# Patient Record
Sex: Male | Born: 1965 | Race: White | Hispanic: No | Marital: Married | Smoking: Current every day smoker
Health system: Southern US, Community
[De-identification: ages and names within clinical notes are randomized; demographics above are authoritative.]

## PROBLEM LIST (undated history)

## (undated) DIAGNOSIS — C61 Malignant neoplasm of prostate: Secondary | ICD-10-CM

## (undated) DIAGNOSIS — I10 Essential (primary) hypertension: Secondary | ICD-10-CM

## (undated) DIAGNOSIS — M1990 Unspecified osteoarthritis, unspecified site: Secondary | ICD-10-CM

## (undated) DIAGNOSIS — E7800 Pure hypercholesterolemia, unspecified: Secondary | ICD-10-CM

## (undated) DIAGNOSIS — M109 Gout, unspecified: Secondary | ICD-10-CM

## (undated) DIAGNOSIS — K259 Gastric ulcer, unspecified as acute or chronic, without hemorrhage or perforation: Secondary | ICD-10-CM

## (undated) HISTORY — PX: KNEE ARTHROSCOPY: SUR90

## (undated) HISTORY — PX: OTHER SURGICAL HISTORY: SHX169

## (undated) HISTORY — PX: APPENDECTOMY: SHX54

## (undated) HISTORY — PX: UMBILICAL HERNIA REPAIR: SHX196

---

## 1998-11-05 ENCOUNTER — Encounter: Payer: Self-pay | Admitting: Family Medicine

## 1998-11-05 ENCOUNTER — Ambulatory Visit (HOSPITAL_COMMUNITY): Admission: RE | Admit: 1998-11-05 | Discharge: 1998-11-05 | Payer: Self-pay | Admitting: Family Medicine

## 1998-11-05 ENCOUNTER — Inpatient Hospital Stay (HOSPITAL_COMMUNITY): Admission: EM | Admit: 1998-11-05 | Discharge: 1998-11-08 | Payer: Self-pay | Admitting: Emergency Medicine

## 1998-11-06 ENCOUNTER — Encounter: Payer: Self-pay | Admitting: Family Medicine

## 1998-11-14 ENCOUNTER — Encounter: Payer: Self-pay | Admitting: Surgery

## 1998-11-14 ENCOUNTER — Ambulatory Visit (HOSPITAL_COMMUNITY): Admission: RE | Admit: 1998-11-14 | Discharge: 1998-11-14 | Payer: Self-pay | Admitting: Surgery

## 1999-01-20 ENCOUNTER — Observation Stay (HOSPITAL_COMMUNITY): Admission: RE | Admit: 1999-01-20 | Discharge: 1999-01-21 | Payer: Self-pay | Admitting: Surgery

## 1999-01-20 ENCOUNTER — Encounter (INDEPENDENT_AMBULATORY_CARE_PROVIDER_SITE_OTHER): Payer: Self-pay | Admitting: Specialist

## 1999-07-20 ENCOUNTER — Emergency Department (HOSPITAL_COMMUNITY): Admission: EM | Admit: 1999-07-20 | Discharge: 1999-07-20 | Payer: Self-pay

## 2007-04-10 ENCOUNTER — Ambulatory Visit: Payer: Self-pay | Admitting: Surgery

## 2007-04-10 ENCOUNTER — Inpatient Hospital Stay (HOSPITAL_COMMUNITY): Admission: EM | Admit: 2007-04-10 | Discharge: 2007-04-27 | Payer: Self-pay | Admitting: Emergency Medicine

## 2007-04-10 ENCOUNTER — Encounter: Admission: RE | Admit: 2007-04-10 | Discharge: 2007-04-10 | Payer: Self-pay | Admitting: Family Medicine

## 2007-04-11 ENCOUNTER — Encounter: Payer: Self-pay | Admitting: Surgery

## 2007-04-20 ENCOUNTER — Ambulatory Visit: Payer: Self-pay | Admitting: Internal Medicine

## 2007-05-09 ENCOUNTER — Ambulatory Visit: Payer: Self-pay | Admitting: Thoracic Surgery (Cardiothoracic Vascular Surgery)

## 2007-05-09 ENCOUNTER — Encounter
Admission: RE | Admit: 2007-05-09 | Discharge: 2007-05-09 | Payer: Self-pay | Admitting: Thoracic Surgery (Cardiothoracic Vascular Surgery)

## 2007-05-15 ENCOUNTER — Encounter
Admission: RE | Admit: 2007-05-15 | Discharge: 2007-05-15 | Payer: Self-pay | Admitting: Thoracic Surgery (Cardiothoracic Vascular Surgery)

## 2007-05-15 ENCOUNTER — Ambulatory Visit: Payer: Self-pay | Admitting: Thoracic Surgery (Cardiothoracic Vascular Surgery)

## 2007-05-23 ENCOUNTER — Ambulatory Visit: Payer: Self-pay | Admitting: Surgery

## 2007-05-23 ENCOUNTER — Encounter: Admission: RE | Admit: 2007-05-23 | Discharge: 2007-05-23 | Payer: Self-pay | Admitting: Surgery

## 2007-06-06 ENCOUNTER — Ambulatory Visit: Payer: Self-pay | Admitting: Surgery

## 2007-07-04 ENCOUNTER — Encounter: Admission: RE | Admit: 2007-07-04 | Discharge: 2007-07-04 | Payer: Self-pay | Admitting: Surgery

## 2007-07-04 ENCOUNTER — Ambulatory Visit: Payer: Self-pay | Admitting: Surgery

## 2008-09-17 IMAGING — CR DG CHEST 2V
2 series · 2 of 2 positions shown · non-contrast
Comparison: 05/09/2007

CLINICAL DATA: Empyema

CHEST - 2 VIEW

[w chest pa]
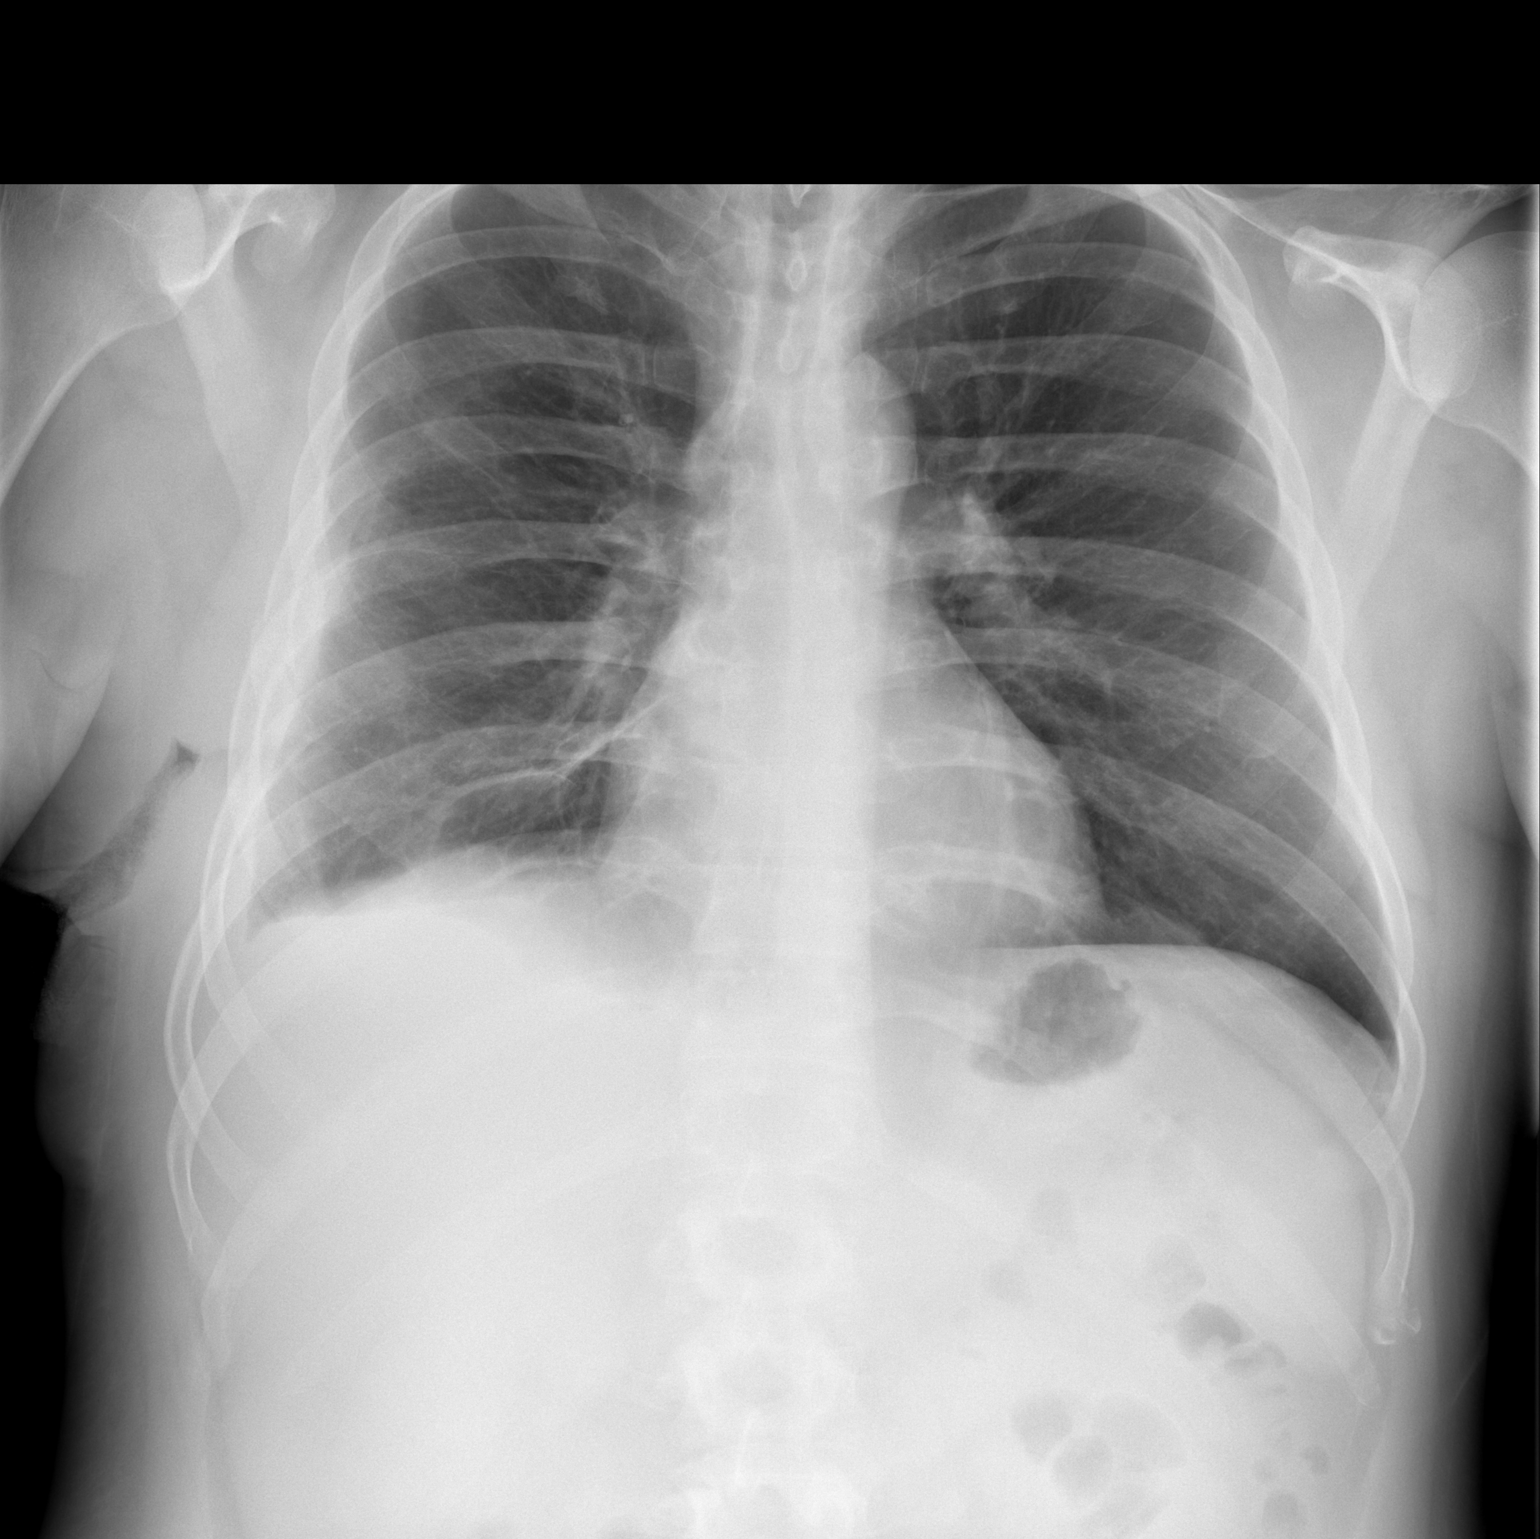

[w chest lat]
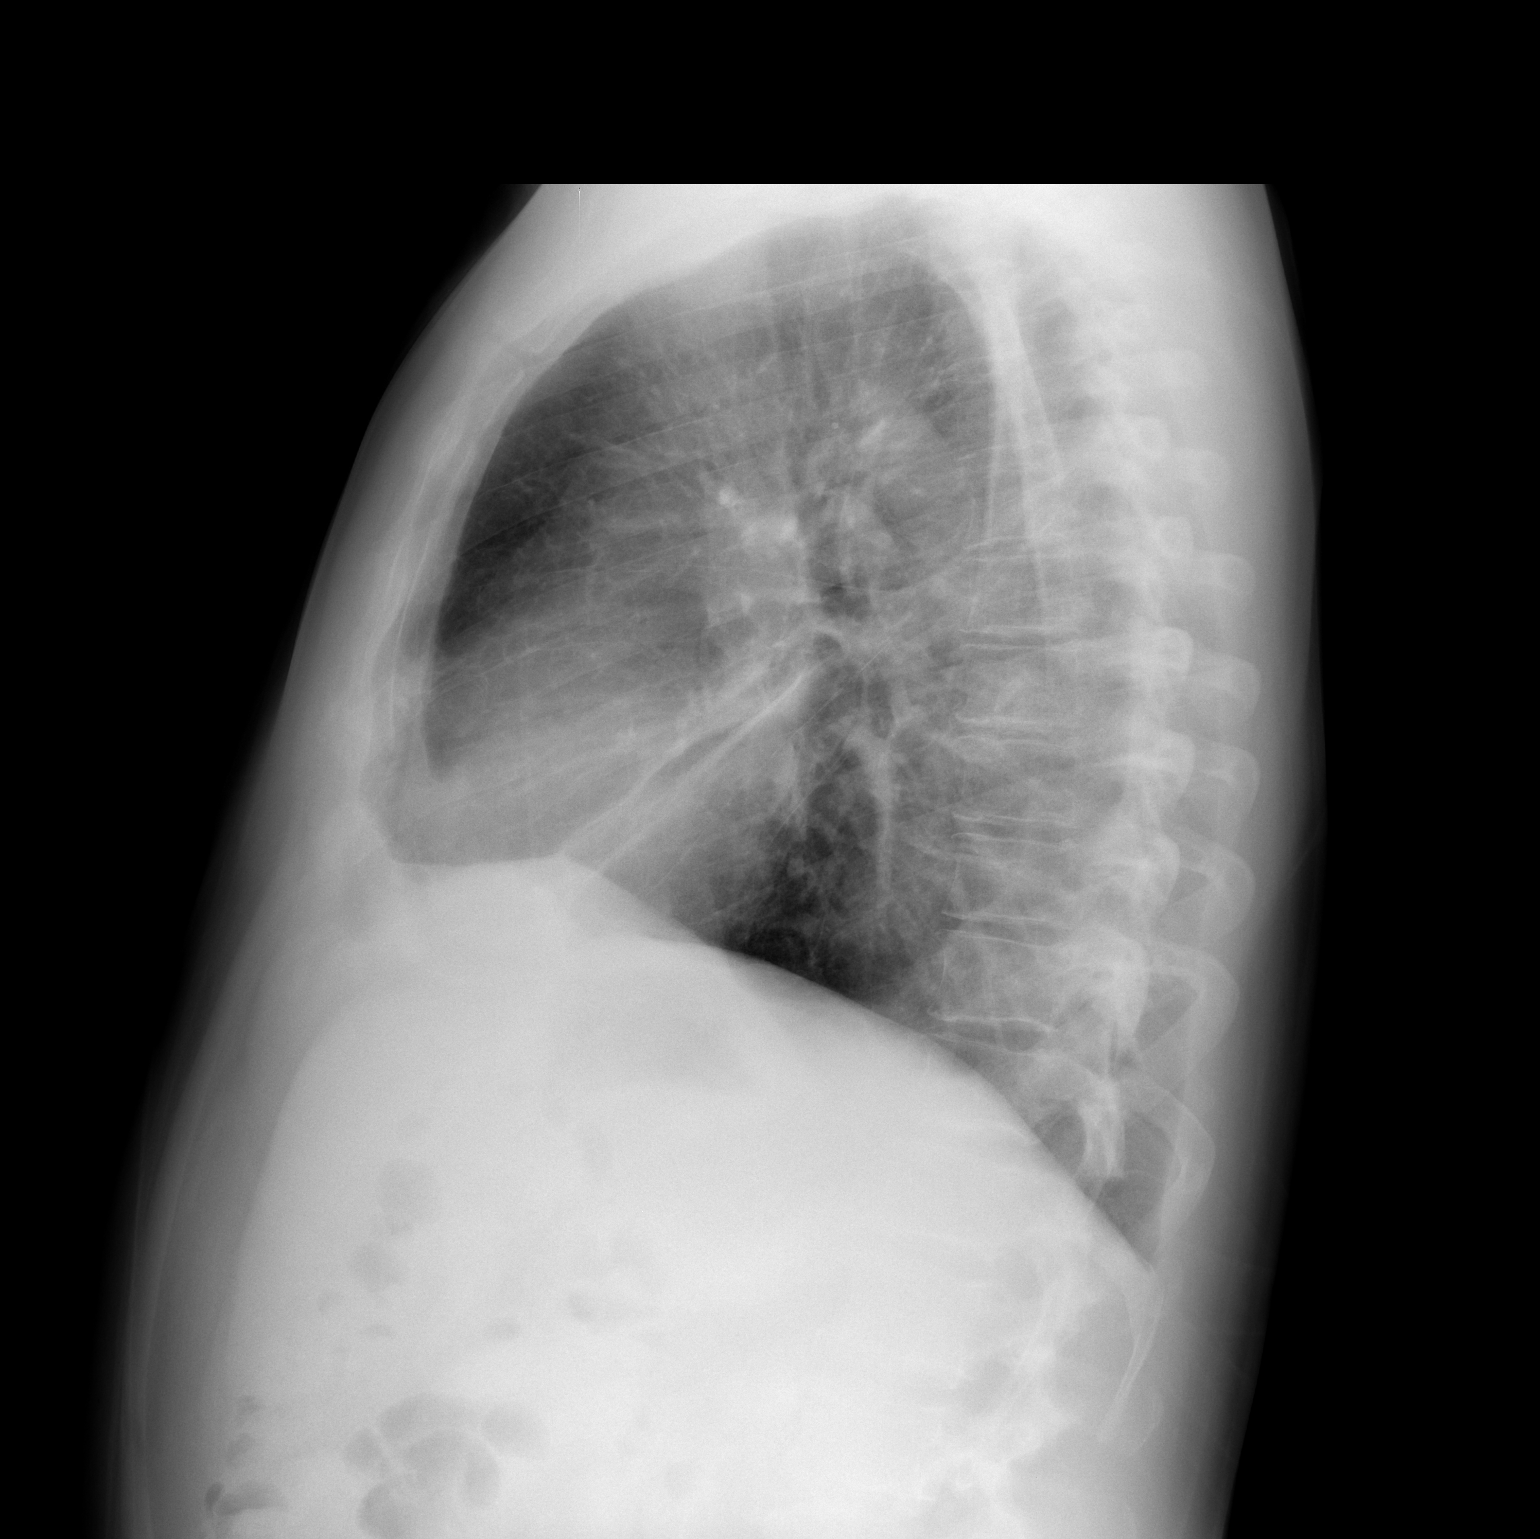

[2 of 2 positions shown; findings below may reference images not displayed]

FINDINGS: The right pleural effusion has again improved.  Minimal
pleural thickening persists. There is no evidence of pneumothorax.
Linear atelectasis at the right base is noted.  No consolidation.
The left lung is clear.  The heart is normal in size.
IMPRESSION: Further improvement in the right pleural effusion.  The residual
abnormalities likely reflects minimal fluid and pleural thickening.
No pneumothorax.

## 2008-09-25 IMAGING — CR DG CHEST 2V
2 series · 2 of 2 positions shown · non-contrast
Comparison: 05/15/2007

CLINICAL DATA: Empyema

CHEST - 2 VIEW

[w chest pa]
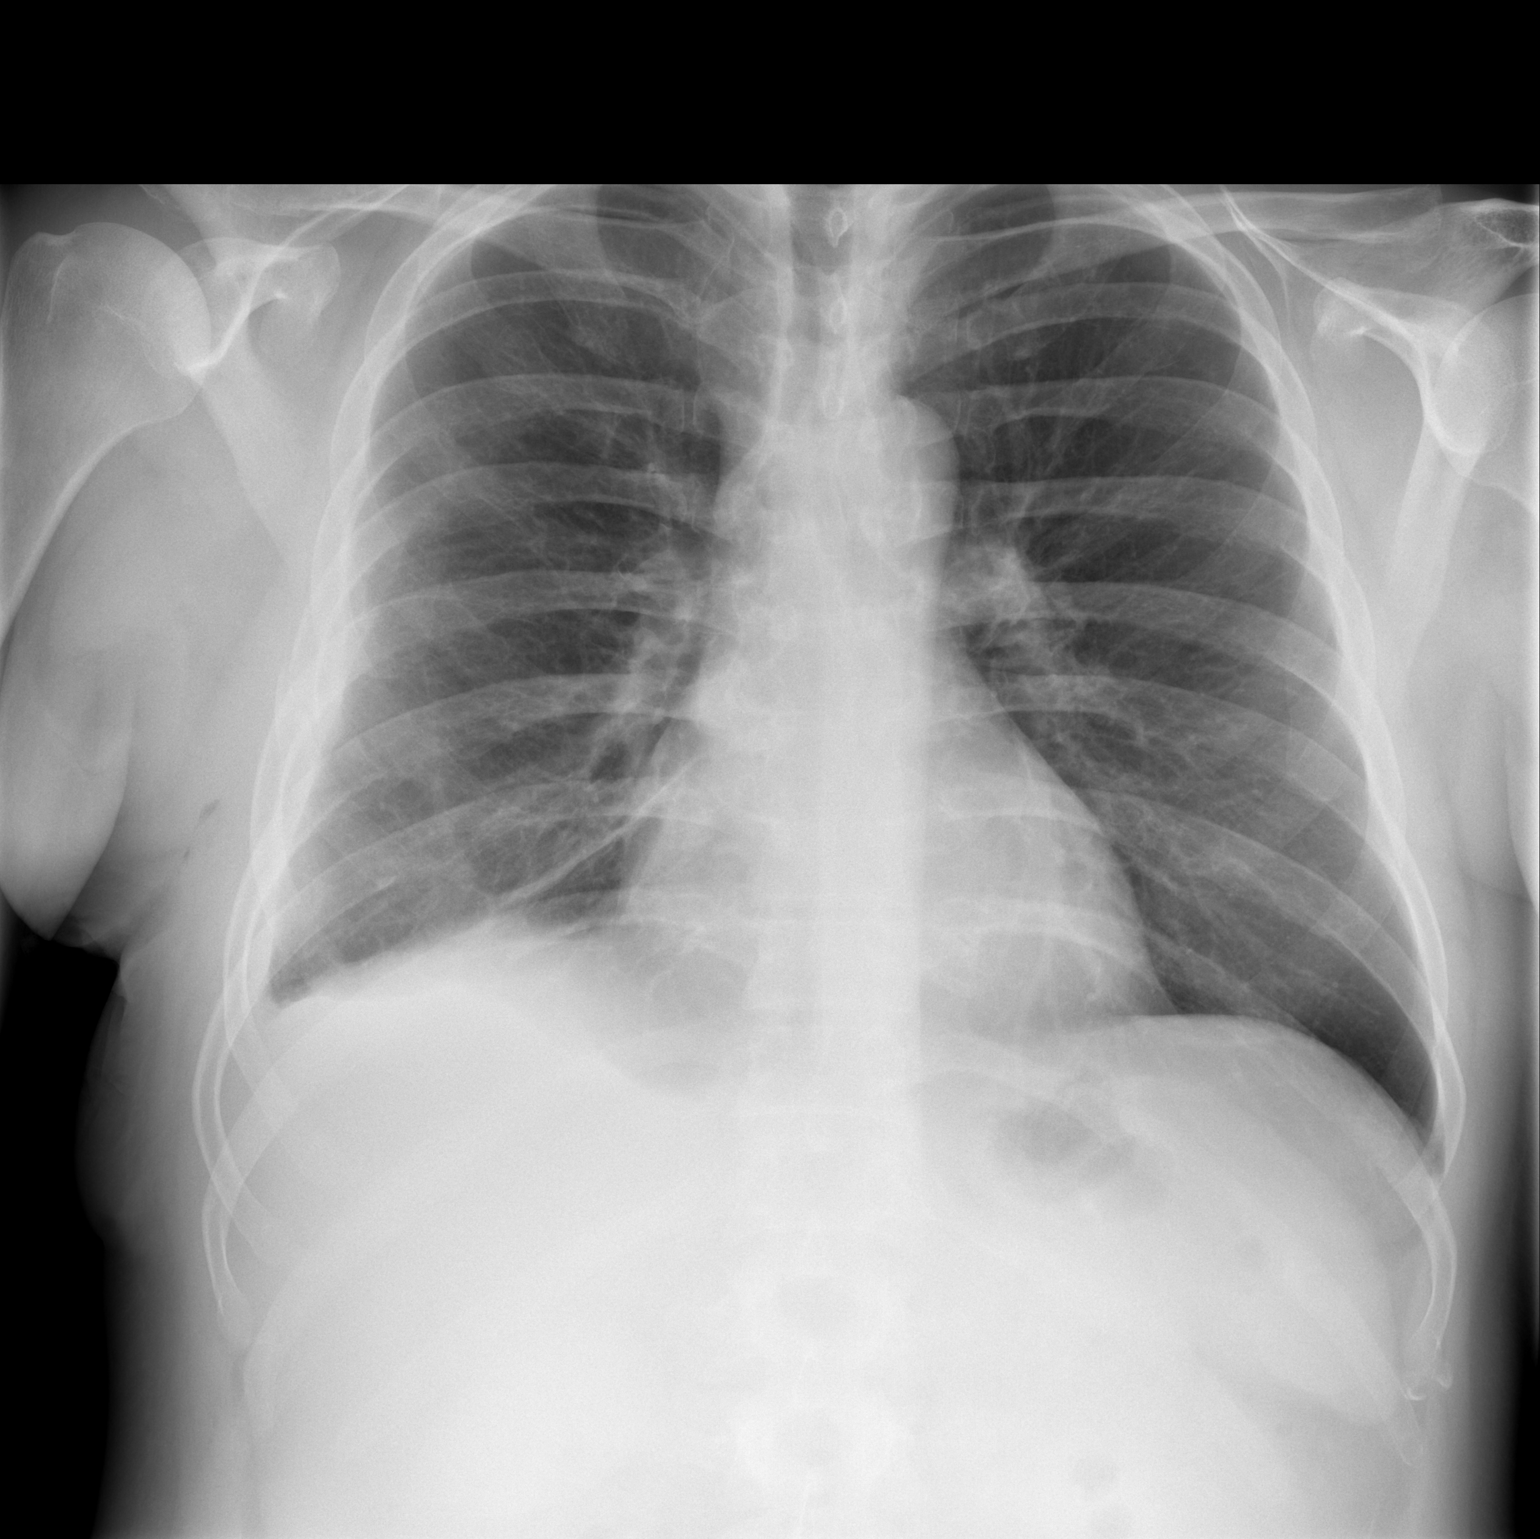

[w chest lat]
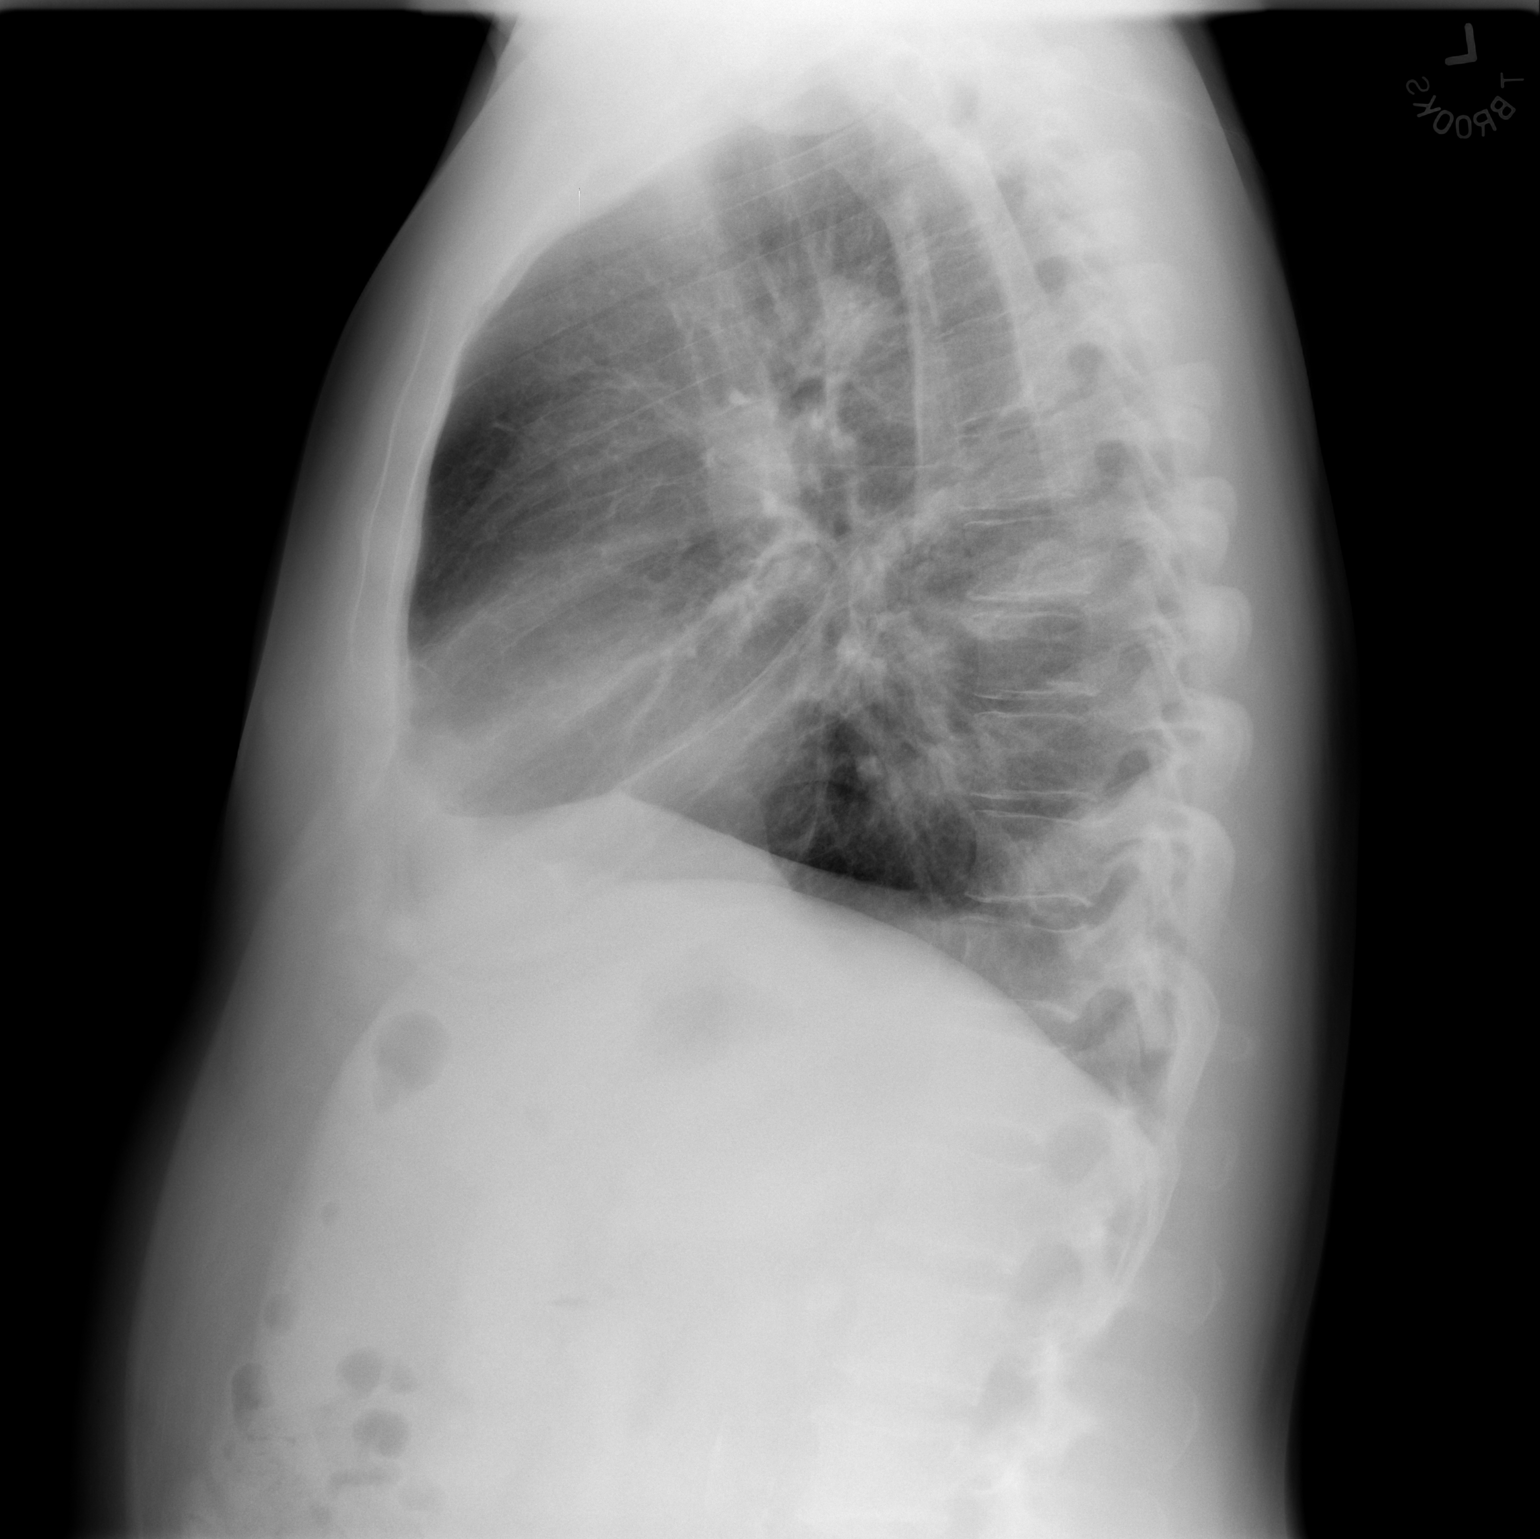

[2 of 2 positions shown; findings below may reference images not displayed]

FINDINGS: A small right pleural effusion versus minimal pleural
thickening and associated right basilar atelectasis have slightly
improved.  No pneumothorax. The heart remains normal in size.
Bronchitic changes are stable.
IMPRESSION: Further improvement and right basilar atelectasis and associated
right pleural thickening versus minimal pleural fluid.  Minimal
residual disease persists.

## 2009-03-21 ENCOUNTER — Ambulatory Visit (HOSPITAL_BASED_OUTPATIENT_CLINIC_OR_DEPARTMENT_OTHER): Admission: RE | Admit: 2009-03-21 | Discharge: 2009-03-21 | Payer: Self-pay | Admitting: Orthopedic Surgery

## 2010-03-30 ENCOUNTER — Emergency Department (HOSPITAL_COMMUNITY)
Admission: EM | Admit: 2010-03-30 | Discharge: 2010-03-31 | Discharge status: Against Medical Advice | Payer: BC Managed Care – PPO | Attending: Emergency Medicine | Admitting: Emergency Medicine

## 2010-03-30 DIAGNOSIS — S0100XA Unspecified open wound of scalp, initial encounter: Secondary | ICD-10-CM | POA: Insufficient documentation

## 2010-03-30 DIAGNOSIS — W19XXXA Unspecified fall, initial encounter: Secondary | ICD-10-CM | POA: Insufficient documentation

## 2010-03-30 LAB — POCT I-STAT 4, (NA,K, GLUC, HGB,HCT)
Glucose, Bld: 113 mg/dL — ABNORMAL HIGH (ref 70–99)
Potassium: 3.7 mEq/L (ref 3.5–5.1)

## 2010-05-19 NOTE — H&P (Signed)
NAMEPARISH, DUBOSE              ACCOUNT NO.:  [id number]   MEDICAL RECORD NO.:  [id number]          PATIENT TYPE:  EMS   LOCATION:  MAJO                         FACILITY:  [facility]   PHYSICIAN:  Evelene Croon, M.D.     DATE OF BIRTH:  09-25-1965   DATE OF ADMISSION:  04/10/2007  DATE OF DISCHARGE:                              HISTORY & PHYSICAL   REASON FOR ADMISSION:  Right empyema.   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  He thought that he  had pulled a muscle and got an abdominal binder to support this area.  The next 3 days, he had severe pain in this same location, especially  with coughing.  He had a dry nonproductive cough and some shortness of  breath.  Then on Thursday and Friday this week, he felt better.  On  Saturday and Sunday, he worsened with more pain, as well as progressive  shortness of breath and fatigue, poor appetite, and a productive cough.   He presented to his primary physician's office today and underwent a  chest x-ray that showed a right pleural effusion and then a CT scan of  the chest was performed at [facility] showing a complex right empyema.  He was told to come to the [facility] Emergency Room.  On presentation  to the emergency room, he was noted to have temperature of 101.8 and a  white blood cell count 38.1.  He was started on intravenous Avelox and I  was consulted to see the patient.   REVIEW OF SYSTEMS:  His review of systems is as follows:  GENERAL:  He  reports having some fever at home.  He denies any chills.  He has had  some fatigue.  He has had poor appetite over the past 48 hours.  EYES:  Negative.  ENT: Denies any recent problems with his teeth.  ENDOCRINE:  Denies diabetes and hypothyroidism.  CARDIOVASCULAR: He denies any  substernal chest pain or pressure.  He has had  exertional dyspnea over  the past 48 hours.  He has had no PND or orthopnea.  He has had no  peripheral edema or palpitations.  RESPIRATORY: He has had a  nonproductive cough beginning last Saturday and lasting for several days  and now has a productive cough, bringing up clear sputum.  Had no  hemoptysis.  GI: He has had no nausea, vomiting.  He denies dysphagia.  He has had some anorexia over the past 48 hours.  He has had no melena  or bright red blood per rectum.  GU: He has had no dysuria or hematuria.  MUSCULOSKELETAL: Denies arthralgias and myalgias other than this right  lateral chest pain.  NEUROLOGICAL: Denies any focal weakness or  numbness.  Denies dizziness and syncope.  Never had TIA or stroke.  HEMATOLOGIC:  He denies any history of bleeding disorders or easy  bleeding.  ALLERGIES:  None.  PSYCHIATRIC:  Negative.  VASCULAR: Denies  any claudication or phlebitis.   PAST MEDICAL HISTORY:  1. Hypertension.  2. Hypercholesterolemia.   PRIMARY CARE PHYSICIAN:  He is treated by Dr. Benedetto Goad with  [facility].   PAST SURGICAL HISTORY:  His only previous surgery was a ruptured  appendix treated by Dr. Wenda Low a few years ago.   SOCIAL HISTORY:  He lives with his wife.  She is a smoker and smokes  about a half-pack of cigarettes per day.  He reports occasional alcohol  use.   FAMILY HISTORY:  Negative.   PHYSICAL EXAMINATION:  VITAL SIGNS:  His temperature is now 98.6.  Blood  pressure is 102/67, pulse is 118 and regular.  Respiratory rate of 18,  nonlabored.  GENERAL:  He is a well-developed white male in no acute distress.  HEENT: Exam shows him to be normocephalic and atraumatic.  Pupils are  equal, reactive to light and accommodation.  Extraocular muscles are  intact.  Throat is clear.  NECK: Exam shows normal carotid pulses bilaterally.  No bruits.  There  is no adenopathy or thyromegaly.  CARDIAC EXAM:  Shows a regular rate and rhythm with  normal S1-S2.  There  is no murmur, rub or gallop.  RESPIRATORY:  Lung exam reveals decreased breath sounds on the right  with rales.  ABDOMINAL EXAM:  Shows active bowel sounds.  His abdomen is  soft and nontender.  No palpable masses or organomegaly.  EXTREMITIES:  Extremity exam shows no peripheral edema.  Pedal pulses  palpable bilaterally.  SKIN:  Warm and dry.  NEUROLOGIC EXAM:  Show to be alert and oriented x3.  Motor and sensory  exams grossly normal.   LABORATORY EXAMINATION:  Shows sodium 129, potassium 2.8, chloride 88,  CO2 29, glucose 122, BUN 9, creatinine 1.06, albumin is low at 2.0,  total protein is 6.5.  White blood cell count is elevated at 38,000,  hemoglobin 13.4, platelet count 535,000.   MEDICATIONS:  His medications prior to admission were:  1. Niaspan 1000 mg daily.  2. Toprol XL daily, unknown dose.  3. Quinapril 40 mg daily.  4. Pravastatin 40 mg daily.   Review of his CT scan shows a large complex right empyema with air  present within the fluid collection.  There is significant compressive  atelectasis and consolidation of right middle lower lobes, and I cannot  rule out a lung abscess in the right lower lobe.  The fact that there is  a large amount of air within the fluid collection makes it highly likely  that there is a lung abscess present.  There is some adenopathy within  the right hilum and mediastinum that may be reactive.  There is no  abnormality within the visualized parts of the abdomen.   IMPRESSION:  Mr. Bowdish has a large complex right empyema, which was  likely related to right lung pneumonia and lung abscess formation.  He  presented with multiple constitutional symptoms as well as fever of  101.8 and leukocytosis of 38,000.  He looks good clinically considering  the above.   We will continue intravenous hydration and correction of hypokalemia and  hyponatremia with intravenous fluid and potassium.  We will continue his   intravenous antibiotics and admit him to the Surgical Intensive Care  Unit.  I will plan to do a right thoracotomy and drainage of the right  empyema tomorrow.  This may require a partial lung resection depending  on how  badly damaged the right lower lobe and right middle lobe are.   I discussed the operative procedure of right thoracotomy and drainage of  the empyema and decortication of the lung with possible lung resection  if needed with him.  I also discussed the alternatives of thoracentesis  and chest tube  placement, which it do not think would treat this problem effectively  due to its complex nature.  I discussed the benefits and risks of  surgery including but not limited to bleeding, blood transfusion,  persistent infection, injury to lung, respiratory failure, prolonged  mechanical ventilation, and death.  He understands all this and agrees  to proceed.      Evelene Croon, M.D.  Electronically Signed     BB/MEDQ  D:  04/10/2007  T:  04/11/2007  Job:  [id number]   cc:   Gloriajean Dell. Andrey Campanile, M.D.

## 2010-05-19 NOTE — Assessment & Plan Note (Signed)
OFFICE VISIT   Gerald Maxwell, Gerald Maxwell  DOB:  [birth year]                                        May 09, 2007  CHART #:  [id number]   HISTORY:  Mr. Gerald Maxwell returns in followup.  He had a right thoracotomy  with periods of empyema and decortication on April 11, 2007 by Dr.  Laneta Simmers.  Postoperatively, he developed a wound infection requiring the  wound to be opened and subsequent VAC placement.  He was discharged home  on April 26, 2007.  He says that he has been feeling well.  He does have  some pain.  He is taking a couple of pain pills in the morning and then  usually a couple at night before he goes to bed.  He usually does not  have to take them during the day.  He does complain of some numbness in  the right subcostal margin.  He has not had difficulty with breathing,  cough or fevers.   PHYSICAL EXAMINATION:  GENERAL:  Mr. Gerald Maxwell is a well-appearing young  man in no acute distress.  VITAL SIGNS:  Blood pressure 142/93, pulse 86, respirations 18, oxygen  saturation 98% on room air.  LUNGS:  Clear with essentially equal breath sounds bilaterally.  Thoracotomy wound is clean and granulating.  There is still a  significant tunnel posteriorly and smaller area anteriorly.  The skin  has contracted significantly.   DIAGNOSTICS:  Chest x-ray shows good aeration of the lungs bilaterally.  There is some blunting of the right costophrenic angle and some pleural  thickening on the right side, but overall excellent appearance.   IMPRESSION:  Mr. Gerald Maxwell is doing well at this point in time.  He does  still have some significant tunnel of the open wound posteriorly.  We  will continue with the [facility] for now.  He probably needs another week or  two of that.  He can be managed with normal saline wet-to-dry.  We will arrange for him to follow up with Dr. Laneta Simmers in about 2-3 weeks.  He does not need another x-ray unless he develops symptoms in the  interim.   Salvatore Decent  Dorris Fetch, M.D.  Electronically Signed   SCH/MEDQ  D:  05/09/2007  T:  05/09/2007  Job:  [id number]   cc:   Gloriajean Dell. Andrey Campanile, M.D.  Evelene Croon, M.D.

## 2010-05-19 NOTE — Op Note (Signed)
Gerald Maxwell, Gerald Maxwell              ACCOUNT NO.:  [id number]   MEDICAL RECORD NO.:  [id number]          PATIENT TYPE:  INP   LOCATION:  2304                         FACILITY:  [facility]   PHYSICIAN:  Evelene Croon, M.D.     DATE OF BIRTH:  Oct 29, 1965   DATE OF PROCEDURE:  04/11/2007  DATE OF DISCHARGE:                               OPERATIVE REPORT   PREOPERATIVE DIAGNOSIS:  Right empyema.   POSTOPERATIVE DIAGNOSIS:  Right empyema.   OPERATIVE PROCEDURES:  1. Flexible fiberoptic bronchoscopy.  2. Right thoracotomy with drainage of empyema and decortication of the      right lung.  3. Insertion of On-Q pain pump.   ATTENDING SURGEON:  Evelene Croon, M.D.   ASSISTANT:  Doree Fudge, PA.   ANESTHESIA:  General endotracheal.   CLINICAL HISTORY:  This patient is a 45 year old gentleman who presented  with about a one-week history of probable pneumonia developing right  empyema which was fairly complex on CT scan with a large fluid  collection with air throughout.  There was atelectasis or consolidation  of the right middle and lower lobes.  The patient presented with a  temperature of 101.8 and white blood cell count of 38,000.  He was  started on intravenous antibiotics and admitted to the surgical  intensive care unit.  I felt the best treatment was to proceed with  right thoracotomy for drainage and decortication.  I also felt that  bronchoscopy was necessary to rule out any endobronchial lesions.  I  discussed the operative procedure with the patient and his wife  including alternatives, benefits, and risks including but not limited to  bleeding, blood transfusion, infection, injury to the lung, prolonged  air leak, need for lung resection, prolonged mechanical ventilation, and  death.  He understood and agreed to proceed.   OPERATIVE PROCEDURE:  The patient was taken to the operating room,  placed on the table in supine position.  After induction of general  endotracheal  anesthesia using a single-lumen tube, a Foley catheter was  placed in the bladder using sterile technique.  A sequential venous  compression stockings were placed on the legs.  Then a time-out was  taken and the proper patient and proper operation were confirmed with  nursing and anesthesia staff.  Flexible fiberoptic bronchoscopy was  performed.  The distal trachea appeared normal.  The carina was sharp.  The left bronchial tree had normal segmental anatomy.  There were no  endobronchial lesions and no compression.  There were no secretions on  the left side.  Then the right bronchial tree was examined.  There was  normal anatomy to the segmental level.  There are no endobronchial  lesions seen.  There was some purulent secretions present.  These were  suctioned and removed.  There was no sign of extrinsic compression.  The  bronchoscope was then withdrawn from the patient.  Then the single-lumen  tube was converted to a double-lumen tube by anesthesiology.  The  patient was then turned to the left lateral decubitus position with the  right side up.  The proper operative  side was confirmed by my initials  placed on the right lateral chest preoperatively in the holding area.  Then the right chest was prepped with Betadine soap and solution and  draped in usual sterile manner.  The right chest was entered through a  lateral thoracotomy incision.  The pleural space was entered through  approximately the sixth intercostal space.  There was a large amount of  foul-smelling purulent fluid present within the pleural space with  multiple loculations posteriorly.  This was suctioned and removed.  Cultures were sent to microbiology.  There was a thick fibrinous peel  over the lower and middle lobes as well as the diaphragm and parietal  pleural surfaces.  This was carefully removed in entirety.  There were  no visible air leaks from the lung.  After this fibrinous peel was  removed from the lung,  the lung was reinflated and there appeared to be  fairly good inflation of the lung.  Some of the debrided fibrinous  exudate was sent to microbiology for anaerobic and aerobic culture.  Then the pleural space was irrigated with copious warm saline solution.  There was complete hemostasis.  Three chest tubes were placed with a  right-angle tube positioned in the posterior costophrenic sulcus, a 36-  French straight tube positioned posteriorly and a 28-French straight  tube positioned anteriorly.   Then the On-Q pain pump was inserted.  A small stab incision was made in  the posterior axillary line and the introducer and trocar was inserted  in a subpleural location so that the sheath was directed posterior to  the thoracotomy incision and one interspace above.  The introducer was  removed and through the sheath a On-Q catheter was inserted.  The sheath  was removed.  Catheter was fixed to the skin with a silk suture.  It was  injected to 5 mL of 50% Marcaine.  Then the ribs were reapproximated  with #2 Vicryl pericostal sutures.  The muscles were closed in layers  with interrupted 0 Vicryl figure-of-eight sutures.  The subcutaneous  tissue was closed with continuous 2-0 Vicryl and skin with staples.  The  wound was irrigated with saline solution prior to closure.  The sponge,  needle and instrument counts were correct according to the scrub nurse.  Dry sterile dressings were applied over the incision and around the  chest tubes which were hooked to Pleur-Evac suction.  The patient was  then turned in the supine position, extubated and transported to the  post anesthesia care unit in satisfactory and stable condition.      Evelene Croon, M.D.  Electronically Signed     BB/MEDQ  D:  04/11/2007  T:  04/11/2007  Job:  [id number]

## 2010-05-19 NOTE — Assessment & Plan Note (Signed)
OFFICE VISIT   Gerald Maxwell, Gerald Maxwell  DOB:  1965-09-04                                        June 06, 2007  CHART #:  [id number]   Mr. Gerald Maxwell returned today for followup of his right chest wound.  His  wife has been cleaning it twice daily with a cotton swab and keeping it  covered with a dry dressing.  He has had no fever or chills.  No  shortness of breath.   On physical examination today, his blood pressure is 191/117.  His pulse  is 82 and regular.  Respiratory rate is 18, unlabored.  Oxygen  saturation on room air is 98%.  He said that he did restart his  quinapril 40 mg daily about 1 week ago, and he is supposed to follow up  with his medical physician this Friday concerning his blood pressure.   On physical examination, he looks well.  The right chest wound is  closing in nicely.  There was a small opening about the size of the head  of a cotton swab, which does track in about 2 cm posteriorly.  There is  no drainage and no pus.  There is no erythema.  He does have some  irritation of the skin around the wound in the shape of the dressings  that had been put on.  He has had a lot of problems with reaction to  tape and bandages.   IMPRESSION:  Overall, Mr. Gerald Maxwell wound is closing in nicely.  I asked  him to continue cleaning it with a cotton swab twice a day to prevent  the skin edges from closing until the tract is healed in.  He will keep  it covered with a dry dressing.  I asked him to contact his medical  physician concerning his hypertension, and this will require further  titration of his medications.  I will plan to see him back in about 3  weeks for followup of his wound.   Evelene Croon, M.D.  Electronically Signed   BB/MEDQ  D:  06/06/2007  T:  06/07/2007  Job:  [id number]

## 2010-05-19 NOTE — Assessment & Plan Note (Signed)
OFFICE VISIT   KEYSTON, ARDOLINO  DOB:  09-19-1965                                        May 15, 2007  CHART #:  [id number]   Mr. Kopke returns in followup.  He is status post right thoracotomy  for drainage of empyema and decortication on 04/11/2007 by Dr. Laneta Simmers.  Postoperatively, he developed a wound infection requiring the wound to  be open with subsequent placement of the VAC Wound device.  He was  discharged on 04/26/2007.  He was last seen in office visit followup  05/09/2007.  Currently, he reports that overall in general he feels as  though he is getting better.  He does have some waxing and waning of  energy level.  He denies fevers, chills or other constitutional  symptoms.  He denies cough.  He denies shortness of breath.  He did have  a mild sore throat a couple days ago which has resolved.  Patient is  under adequate control with dosing typically in the morning and the  evening.  He states today on dressing change by the nurse, there was  some leakage of some purulent-type fluid.  He is here on today's date  for reevaluation of the wound.   PHYSICAL EXAMINATION:  VITAL SIGNS:  Temperature is 98.6.  Blood  pressure 134/86.  Pulse of 97.  Respirations 18.  Oxygen saturation is  99% on room air.  PULMONARY:  Reveals clear breath sounds bilaterally.  CARDIAC:  Regular rate and rhythm.  The wound is examined.  There does appear to be good granulation tissue  at the base of the wound.  The wound does track the entire length of the  thoracotomy incision.  It appears as though the [facility] device is not  adequately dressing the entire pocket of wound.  I discussed this with  Dr. Cornelius Moras.  At this time, we will discontinue the use of the VAC device  and pack the wound entirely with wet-to-dry saline dressings so as to  address the entire length of the wound.  This will be done on a b.i.d.  basis by the home nursing company.  Additional plan will be for  an  appointment to follow the patient back in 2 weeks for reevaluation.  He  of course can be seen on a p.r.n. basis, if he develops any new issues  related to this.   Rowe Clack, P.A.-C.   Sherryll Burger  D:  05/15/2007  T:  05/15/2007  Job:  [id number]   cc:   Evelene Croon, M.D.  Gloriajean Dell. Andrey Campanile, M.D.

## 2010-05-19 NOTE — Discharge Summary (Signed)
Gerald Maxwell, Gerald Maxwell              ACCOUNT NO.:  [id number]   MEDICAL RECORD NO.:  [id number]          PATIENT TYPE:  INP   LOCATION:  2023                         FACILITY:  [facility]   PHYSICIAN:  Evelene Croon, M.D.     DATE OF BIRTH:  Jul 08, 1965   DATE OF ADMISSION:  04/10/2007  DATE OF DISCHARGE:  04/26/2007                               DISCHARGE SUMMARY   PRIMARY ADMITTING DIAGNOSIS:  Right empyema.   ADDITIONAL/DISCHARGE DIAGNOSES:  1. Strep viridans, right empyema.  2. Hypertension.  3. Hypercholesterolemia.  4. Bradycardia during hospitalization.  5. Wound dehiscence with placement of wound VAC.   PROCEDURES PERFORMED:  1. Flexible fiberoptic bronchoscopy.  2. Right thoracotomy with drainage of empyema and right lung      decortication.   HISTORY:  The patient is a 45 year old male who developed significant  right-sided chest pain following a day of lifting 50-pound bags of  fertilizer.  He felt that he had pulled a muscle and treated  conservatively at home.  However, he continued to have severe pain in  the area with an associated dry nonproductive cough and shortness of  breath.  He was seen on the date of this admission in his primary care  physician's office and had a chest x-ray which showed a right pleural  effusion.  He subsequently underwent a CT of the chest which showed a  complex right empyema.  Because of this, he was referred to the  emergency department at [facility] for further evaluation.  On  presentation to the ER, he was noted to have a temperature of 101.8 and  white blood cell count of 38,000.  He was started on IV Avelox and a  thoracic surgery consultation was obtained.  Dr. Laneta Simmers saw the patient  in the ER and felt that he should be admitted for treatment of right-  sided pneumonia with lung abscess formation and be considered for a VAT  drainage of the empyema.   HOSPITAL COURSE:  Gerald Maxwell was admitted and continued on IV  antibiotics.  He was taken to the operating room on April 11, 2007, and  underwent a right lung decortication with drainage of empyema.  He  tolerated the procedure well and was transferred back to the floor in  stable condition.  He was continued on vancomycin and Avelox IV until  intraoperative cultures grew out Strep viridans.  At that point, the  vancomycin was discontinued and he was started on IV Rocephin.  His  chest tube was left in place until all drainage had resolved.  On April 20, 2007, the chest tube was discontinued without problem.  A followup  chest x-ray was stable with no pneumothorax.  During his admission, the  patient had several episodes of bradycardia while at rest.  He was  restarted postoperatively on his home dose of beta blocker and because  of his bradycardia and low blood pressures, his beta-blocker dose was  decreased.  Since that time, his rhythm has remained stable.  Also  postoperatively, he developed significant drainage from his thoracotomy  incision.  Several  of the skin staples were removed and the wound opened  and drained significantly.  For several days, it was treated with saline  wet-to-dry dressing changes and ultimately a wound VAC was placed on  April 24, 2007.  Drainage from the wound was cultured and there has been  no growth so far.  However, the original Gram stain showed Gram-negative  rods.  An ID consult was obtained, and it was felt that the patient  should be changed to IV Unasyn to expand anaerobic coverage.  Dr.  Orvan Falconer had followed the patient and feels that he would continue  Unasyn IV until the time of discharge and at that point the patient  could be converted to p.o. Augmentin for a total of 4 weeks of  antibiotics therapy.  He is currently making progress.  He is on day #15  total of antibiotics and day #5 of Unasyn on April 25, 2007.  His wound  has a VAC in place.  At this point, it will be changed on April 26, 2007.   Otherwise, he is remaining stable.  His chest x-rays have been  stable.  His most recent labs show a hemoglobin of 10.9, hematocrit  32.4, platelets 673, and white count 11.4.  He is maintaining O2 sats of  greater than 90% on room air and has remained afebrile.  As mentioned,  he will have a wound VAC change on April 26, 2007.  If at that time  everything has remained stable and the wound is granulating well, he  will hopefully be ready for discharge home.   DISCHARGE MEDICATIONS:  1. Augmentin 875 mg b.i.d. x2 weeks.  2. Toprol-XL 25 mg daily.  3. Nicotine patch 14 mg daily.  4. Percocet 5/325 one to two q.4-6 h. p.r.n. for pain.  5. Niacin 1000 mg 2 tablets at bedtime.  6. Pravastatin 40 mg daily.  7. Aspirin 325 mg daily.   DISCHARGE INSTRUCTIONS:  He is asked to refrain from driving, heavy  lifting, or strenuous activity.  He may continue ambulating daily and  using his incentive spirometer.  Home health nurse will be arranged for  wound VAC changes on Monday, Wednesday, and Friday.  He will continue  same preoperative diet.   DISCHARGE FOLLOWUP:  He will be contacted by the [facility] office with an  appointment see Dr. Laneta Simmers with a chest x-ray 30 minutes prior to this  appointment. In the interim, he is asked to contact our office if he  experiences any problems or has questions.      Coral Ceo, P.A.      Evelene Croon, M.D.  Electronically Signed    GC/MEDQ  D:  04/25/2007  T:  04/26/2007  Job:  [id number]   cc:   Gloriajean Dell. Andrey Campanile, M.D.

## 2010-05-19 NOTE — Assessment & Plan Note (Signed)
OFFICE VISIT   Gerald Maxwell, Gerald Maxwell  DOB:  1965/05/20                                        May 23, 2007  CHART #:  [id number]   The patient returns today for followup of his right chest wound status  post right thoracotomy and drainage of empyema with postoperative wound  infection.  This was initially treated with a wound VAC and now he is  doing twice daily wet-to-dry dressings.  He has had no fever or chills  and has been feeling fairly well overall.   PHYSICAL EXAMINATION:  Today, his blood pressure is 160/106 on the left  and 175/104 on the right.  He said that he has not taken his blood  pressure medication yesterday.  Pulse is 73 and regular.  Respiratory  rate is 18, unlabored.  Oxygen saturation on room air is 98%.  He looks  well.  His lungs are clear.  The right chest wound is reportedly healing  in fairly quickly.  There is no drainage.  The deep tissue is beefy red  granulation tissue.  I was able to pack it lightly with one 2 x 2 gauze.   Followup chest x-ray today looks good.  There are minimal postoperative  changes on the right with mild pleural thickening.  There is no  effusion.   IMPRESSION:  Overall, the patient is doing well following his procedure.  He will continue wet-to-dry dressing changes twice a day at home.  I  will plan to see him back in 2 weeks for followup.  I advised him to  continue his blood pressure medication which has been regulating his  blood pressure fairly well prior to today.   Evelene Croon, M.D.  Electronically Signed   BB/MEDQ  D:  05/23/2007  T:  05/23/2007  Job:  [id number]

## 2010-05-19 NOTE — Assessment & Plan Note (Signed)
OFFICE VISIT   BEAUMONT, AUSTAD  DOB:  [birth year]                                        July 04, 2007  CHART #:  [id number]   HISTORY:  The patient returned today for followup of his right chest  incision infection status post right thoracotomy and drainage of empyema  and decortication of the lung.  He has had no fever or chills and has  been feeling well overall.  He continues to abstain from smoking.  He  said there is minimal drainage from the site and it has continued to  close in.   PHYSICAL EXAMINATION:  VITAL SIGNS:  His blood pressure is recorded as  170/113, his pulse is 88 and regular, respiratory rate is 18 and  unlabored, and oxygen saturation on room air is 99%.  I repeated his  blood pressure and it is 150/95.  GENERAL:  He looks well.  CARDIAC:  A regular rate and rhythm with normal heart sounds.  LUNGS:  Exam is clear.  CHEST:  The right chest incision is essentially completely healed.  There is a tiny opening at the site of the previous wound which is not  large enough to admit the head of a cotton swab.  There is no drainage.   Followup chest x-ray shows continued improvement in the right lower lobe  aeration and minimal residual pleural thickening.   IMPRESSION:  Overall, the patient seems to have recovered completely  from his chest wall infection.  I told him he did not need to do any  further dressing changes for this.  He will return to our office if he  develops any further signs of chest wall infection.   Evelene Croon, M.D.  Electronically Signed   BB/MEDQ  D:  07/04/2007  T:  07/05/2007  Job:  [id number]

## 2010-09-29 LAB — CBC
HCT: 29.8 — ABNORMAL LOW
HCT: 30.6 — ABNORMAL LOW
HCT: 30.9 — ABNORMAL LOW
HCT: 33.7 — ABNORMAL LOW
HCT: 36.1 — ABNORMAL LOW
HCT: 30.3 — ABNORMAL LOW
Hemoglobin: 10.4 — ABNORMAL LOW
Hemoglobin: 10.9 — ABNORMAL LOW
Hemoglobin: 11.6 — ABNORMAL LOW
Hemoglobin: 12.8 — ABNORMAL LOW
Hemoglobin: 10.3 — ABNORMAL LOW
MCHC: 33.9
MCHC: 34.2
MCHC: 34.2
MCHC: 34.3
MCHC: 34.9
MCHC: 35.6
MCHC: 33.5
MCHC: 33.9
MCV: 96.5
MCV: 96.6
MCV: 96.7
MCV: 97.4
MCV: 98.3
MCV: 98.4
MCV: 96.2
MCV: 97.1
Platelets: 471 — ABNORMAL HIGH
Platelets: 651 — ABNORMAL HIGH
Platelets: 673 — ABNORMAL HIGH
Platelets: 689 — ABNORMAL HIGH
Platelets: 744 — ABNORMAL HIGH
RBC: 2.97 — ABNORMAL LOW
RBC: 3.17 — ABNORMAL LOW
RBC: 3.73 — ABNORMAL LOW
RBC: 4.01 — ABNORMAL LOW
RBC: 3.18 — ABNORMAL LOW
RDW: 14.4
RDW: 14.5
RDW: 14.6
RDW: 15
RDW: 14.4
WBC: 21.2 — ABNORMAL HIGH
WBC: 23.1 — ABNORMAL HIGH
WBC: 24.5 — ABNORMAL HIGH
WBC: 11.4 — ABNORMAL HIGH
WBC: 13.6 — ABNORMAL HIGH

## 2010-09-29 LAB — BASIC METABOLIC PANEL
BUN: 12
BUN: 2 — ABNORMAL LOW
CO2: 30
CO2: 31
Calcium: 8.7
Chloride: 93 — ABNORMAL LOW
Chloride: 95 — ABNORMAL LOW
GFR calc Af Amer: >60
GFR calc non Af Amer: >60
Glucose, Bld: 106 — ABNORMAL HIGH
Glucose, Bld: 113 — ABNORMAL HIGH
Glucose, Bld: 96
Potassium: 3.9
Potassium: 4.4
Sodium: 131 — ABNORMAL LOW
Sodium: 133 — ABNORMAL LOW
Sodium: 135

## 2010-09-29 LAB — DIFFERENTIAL
Basophils Absolute: 0
Eosinophils Absolute: 0
Lymphocytes Relative: 4 — ABNORMAL LOW
Monocytes Absolute: 1.1 — ABNORMAL HIGH
Neutrophils Relative %: 93 — ABNORMAL HIGH

## 2010-09-29 LAB — URINALYSIS, ROUTINE W REFLEX MICROSCOPIC
Glucose, UA: NEGATIVE
Hgb urine dipstick: NEGATIVE
Specific Gravity, Urine: >1.046 — ABNORMAL HIGH
Urobilinogen, UA: 4 — ABNORMAL HIGH

## 2010-09-29 LAB — ANAEROBIC CULTURE

## 2010-09-29 LAB — POCT I-STAT 7, (LYTES, BLD GAS, ICA,H+H)
Acid-Base Excess: 5 — ABNORMAL HIGH
Calcium, Ion: 1.1 — ABNORMAL LOW
O2 Saturation: 83
Patient temperature: 38.2
Potassium: 3.8
Sodium: 135
TCO2: 35
pCO2 arterial: 64.3

## 2010-09-29 LAB — CULTURE, ROUTINE-ABSCESS

## 2010-09-29 LAB — COMPREHENSIVE METABOLIC PANEL
AST: 63 — ABNORMAL HIGH
BUN: 9
CO2: 29
Calcium: 8.5
Creatinine, Ser: 1.06
GFR calc Af Amer: >60
GFR calc non Af Amer: >60
Glucose, Bld: 122 — ABNORMAL HIGH

## 2010-09-29 LAB — POCT I-STAT 3, ART BLOOD GAS (G3+)
Acid-Base Excess: 5 — ABNORMAL HIGH
O2 Saturation: 91
TCO2: 32
pCO2 arterial: 51.1 — ABNORMAL HIGH
pO2, Arterial: 63 — ABNORMAL LOW

## 2010-09-29 LAB — TYPE AND SCREEN: Antibody Screen: NEGATIVE

## 2010-09-29 LAB — WOUND CULTURE

## 2017-04-28 DIAGNOSIS — D7589 Other specified diseases of blood and blood-forming organs: Secondary | ICD-10-CM | POA: Insufficient documentation

## 2017-08-17 DIAGNOSIS — R200 Anesthesia of skin: Secondary | ICD-10-CM | POA: Insufficient documentation

## 2018-07-23 DIAGNOSIS — Z8711 Personal history of peptic ulcer disease: Secondary | ICD-10-CM | POA: Insufficient documentation

## 2018-07-23 DIAGNOSIS — K255 Chronic or unspecified gastric ulcer with perforation: Secondary | ICD-10-CM | POA: Insufficient documentation

## 2018-09-29 ENCOUNTER — Other Ambulatory Visit: Payer: Self-pay | Admitting: Orthopedic Surgery

## 2018-09-29 ENCOUNTER — Telehealth: Payer: Self-pay | Admitting: Nurse Practitioner

## 2018-09-29 DIAGNOSIS — G8929 Other chronic pain: Secondary | ICD-10-CM

## 2018-09-29 NOTE — Telephone Encounter (Signed)
Phone call to patient to verify medication list and allergies for myelogram procedure. Pt aware he will not need to hold any medications for this procedure. Pre and post procedure instructions reviewed with pt. Pt verbalized understanding.  

## 2018-10-05 ENCOUNTER — Other Ambulatory Visit: Payer: Self-pay

## 2018-10-05 ENCOUNTER — Ambulatory Visit
Admission: RE | Admit: 2018-10-05 | Discharge: 2018-10-05 | Disposition: A | Discharge status: Home / Self Care | Payer: 59 | Source: Ambulatory Visit | Attending: Orthopedic Surgery | Admitting: Orthopedic Surgery

## 2018-10-05 DIAGNOSIS — M545 Low back pain, unspecified: Secondary | ICD-10-CM

## 2018-10-05 DIAGNOSIS — G8929 Other chronic pain: Secondary | ICD-10-CM

## 2018-10-05 MED ORDER — IOPAMIDOL (ISOVUE-M 200) INJECTION 41%
15.0000 mL | Freq: Once | INTRAMUSCULAR | Status: AC
  Administered 2018-10-05: 14:00:00 15 mL via EPIDURAL

## 2018-10-05 MED ORDER — DIAZEPAM 5 MG PO TABS
10.0000 mg | ORAL_TABLET | Freq: Once | ORAL | Status: AC
  Administered 2018-10-05: 5 mg via ORAL

## 2018-10-05 NOTE — Discharge Instructions (Signed)

## 2018-11-06 NOTE — Patient Instructions (Signed)
DUE TO COVID-19 ONLY ONE VISITOR IS ALLOWED TO COME WITH YOU AND STAY IN THE WAITING ROOM ONLY DURING PRE OP AND PROCEDURE DAY OF SURGERY. THE 1 VISITOR MAY VISIT WITH YOU AFTER SURGERY IN YOUR PRIVATE ROOM DURING VISITING HOURS ONLY!  YOU NEED TO HAVE A COVID 19 TEST ON_11/7/2020______ , THIS TEST MUST BE DONE BEFORE SURGERY, COME  [address], [place] , [id number].  [facility]) ONCE YOUR COVID TEST IS COMPLETED, PLEASE BEGIN THE QUARANTINE INSTRUCTIONS AS OUTLINED IN YOUR HANDOUT.                Gerald Maxwell   Your procedure is scheduled on: 11/15/2018   Report to [facility] Main  Entrance   Report to admitting at 0630 AM     Call this number if you have problems the morning of surgery [phone]    Remember: Do not eat food or drink liquids :After Midnight. BRUSH YOUR TEETH MORNING OF SURGERY AND RINSE YOUR MOUTH OUT, NO CHEWING GUM CANDY OR MINTS.     Take these medicines the morning of surgery with A SIP OF WATER: Zyloprim, Pepcid, Neurontin, Prednisone.                                 You may not have any metal on your body including hair pins and              piercings  Do not wear jewelry, make-up, lotions, powders or perfumes, deodorant             Do not wear nail polish on your fingernails.  Do not shave  48 hours prior to surgery.              Men may shave face and neck.   Do not bring valuables to the hospital. [facility] IS NOT             RESPONSIBLE   FOR VALUABLES.  Contacts, dentures or bridgework may not be worn into surgery.  Leave suitcase in the car. After surgery it may be brought to your room.  NO SOLID FOOD AFTER MIDNIGHT THE NIGHT PRIOR TO SURGERY. NOTHING BY MOUTH EXCEPT CLEAR LIQUIDS UNTIL 0530 . PLEASE FINISH ENSURE DRINK PER SURGEON ORDER  WHICH NEEDS TO BE COMPLETED AT 0530 AM  .     CLEAR LIQUID DIET   Foods Allowed                                                                     Foods  Excluded  Coffee and tea, regular and decaf                             liquids that you cannot  Plain Jell-O any favor except red or purple                                           see through such as: Fruit ices (not with fruit pulp)  milk, soups, orange juice  Iced Popsicles                                    All solid food Carbonated beverages, regular and diet                                    Cranberry, grape and apple juices Sports drinks like Gatorade Lightly seasoned clear broth or consume(fat free) Sugar, honey syrup  Sample Menu Breakfast                                Lunch                                     Supper Cranberry juice                    Beef broth                            Chicken broth Jell-O                                     Grape juice                           Apple juice Coffee or tea                        Jell-O                                      Popsicle                                                Coffee or tea                        Coffee or tea  _____________________________________________________________________  [facility] - Preparing for Surgery Before surgery, you can play an important role.  Because skin is not sterile, your skin needs to be as free of germs as possible.  You can reduce the number of germs on your skin by washing with CHG (chlorahexidine gluconate) soap before surgery.  CHG is an antiseptic cleaner which kills germs and bonds with the skin to continue killing germs even after washing. Please DO NOT use if you have an allergy to CHG or antibacterial soaps.  If your skin becomes reddened/irritated stop using the CHG and inform your nurse when you arrive at Short Stay. Do not shave (including legs and underarms) for at least 48 hours prior to the first CHG shower.  You may shave your face/neck.  Please follow these instructions carefully:  1.  Shower with CHG Soap the night before surgery  and the  morning of surgery.  2.  If you choose  to wash your hair, wash your hair first as usual with your normal  shampoo.  3.  After you shampoo, rinse your hair and body thoroughly to remove the shampoo.                             4.  Use CHG as you would any other liquid soap.  You can apply chg directly to the skin and wash.  Gently with a scrungie or clean washcloth.  5.  Apply the CHG Soap to your body ONLY FROM THE NECK DOWN.   Do   not use on face/ open                           Wound or open sores. Avoid contact with eyes, ears mouth and   genitals (private parts).                       Wash face,  Genitals (private parts) with your normal soap.             6.  Wash thoroughly, paying special attention to the area where your    surgery  will be performed.  7.  Thoroughly rinse your body with warm water from the neck down.  8.  DO NOT shower/wash with your normal soap after using and rinsing off the CHG Soap.                9.  Pat yourself dry with a clean towel.            10.  Wear clean pajamas.            11.  Place clean sheets on your bed the night of your first shower and do not  sleep with pets. Day of Surgery : Do not apply any lotions/deodorants the morning of surgery.  Please wear clean clothes to the hospital/surgery center.  FAILURE TO FOLLOW THESE INSTRUCTIONS MAY RESULT IN THE CANCELLATION OF YOUR SURGERY  PATIENT SIGNATURE_________________________________  NURSE SIGNATURE__________________________________  ________________________________________________________________________    Gerald Maxwell  An incentive spirometer is a tool that can help keep your lungs clear and active. This tool measures how well you are filling your lungs with each breath. Taking long deep breaths may help reverse or decrease the chance of developing breathing (pulmonary) problems (especially infection) following:  A long period of time when you are unable to move or be  active. BEFORE THE PROCEDURE   If the spirometer includes an indicator to show your best effort, your nurse or respiratory therapist will set it to a desired goal.  If possible, sit up straight or lean slightly forward. Try not to slouch.  Hold the incentive spirometer in an upright position. INSTRUCTIONS FOR USE  1. Sit on the edge of your bed if possible, or sit up as far as you can in bed or on a chair. 2. Hold the incentive spirometer in an upright position. 3. Breathe out normally. 4. Place the mouthpiece in your mouth and seal your lips tightly around it. 5. Breathe in slowly and as deeply as possible, raising the piston or the ball toward the top of the column. 6. Hold your breath for 3-5 seconds or for as long as possible. Allow the piston or ball to fall to the bottom of the column. 7. Remove the mouthpiece from your mouth and breathe out  normally. 8. Rest for a few seconds and repeat Steps 1 through 7 at least 10 times every 1-2 hours when you are awake. Take your time and take a few normal breaths between deep breaths. 9. The spirometer may include an indicator to show your best effort. Use the indicator as a goal to work toward during each repetition. 10. After each set of 10 deep breaths, practice coughing to be sure your lungs are clear. If you have an incision (the cut made at the time of surgery), support your incision when coughing by placing a pillow or rolled up towels firmly against it. Once you are able to get out of bed, walk around indoors and cough well. You may stop using the incentive spirometer when instructed by your caregiver.  RISKS AND COMPLICATIONS  Take your time so you do not get dizzy or light-headed.  If you are in pain, you may need to take or ask for pain medication before doing incentive spirometry. It is harder to take a deep breath if you are having pain. AFTER USE  Rest and breathe slowly and easily.  It can be helpful to keep track of a log of  your progress. Your caregiver can provide you with a simple table to help with this. If you are using the spirometer at home, follow these instructions: SEEK MEDICAL CARE IF:   You are having difficultly using the spirometer.  You have trouble using the spirometer as often as instructed.  Your pain medication is not giving enough relief while using the spirometer.  You develop fever of 100.5 F (38.1 C) or higher. SEEK IMMEDIATE MEDICAL CARE IF:   You cough up bloody sputum that had not been present before.  You develop fever of 102 F (38.9 C) or greater.  You develop worsening pain at or near the incision site. MAKE SURE YOU:   Understand these instructions.  Will watch your condition.  Will get help right away if you are not doing well or get worse. Document Released: 05/03/2006 Document Revised: 03/15/2011 Document Reviewed: 07/04/2006 [facility] Patient Information 2014 [employer] [place].   ________________________________________________________________________

## 2018-11-10 ENCOUNTER — Encounter (HOSPITAL_COMMUNITY)
Admission: RE | Admit: 2018-11-10 | Discharge: 2018-11-10 | Disposition: A | Discharge status: Home / Self Care | Payer: 59 | Source: Ambulatory Visit | Attending: Orthopedic Surgery | Admitting: Orthopedic Surgery

## 2018-11-10 ENCOUNTER — Ambulatory Visit (HOSPITAL_COMMUNITY)
Admission: RE | Admit: 2018-11-10 | Discharge: 2018-11-10 | Disposition: A | Discharge status: Home / Self Care | Payer: 59 | Source: Ambulatory Visit | Attending: Surgical | Admitting: Surgical

## 2018-11-10 ENCOUNTER — Other Ambulatory Visit: Payer: Self-pay

## 2018-11-10 ENCOUNTER — Encounter (HOSPITAL_COMMUNITY): Payer: Self-pay

## 2018-11-10 DIAGNOSIS — M545 Low back pain, unspecified: Secondary | ICD-10-CM

## 2018-11-10 HISTORY — DX: Essential (primary) hypertension: I10

## 2018-11-10 HISTORY — DX: Gout, unspecified: M10.9

## 2018-11-10 HISTORY — DX: Pure hypercholesterolemia, unspecified: E78.00

## 2018-11-10 HISTORY — DX: Gastric ulcer, unspecified as acute or chronic, without hemorrhage or perforation: K25.9

## 2018-11-10 LAB — CBC WITH DIFFERENTIAL/PLATELET
Abs Immature Granulocytes: 0.04 10*3/uL (ref 0.00–0.07)
Basophils Absolute: 0 10*3/uL (ref 0.0–0.1)
Basophils Relative: 0 %
Eosinophils Absolute: 0 10*3/uL (ref 0.0–0.5)
Eosinophils Relative: 0 %
HCT: 44.5 % (ref 39.0–52.0)
Hemoglobin: 15.1 g/dL (ref 13.0–17.0)
Immature Granulocytes: 1 %
Lymphocytes Relative: 12 %
Lymphs Abs: 0.9 10*3/uL (ref 0.7–4.0)
MCH: 36.7 pg — ABNORMAL HIGH (ref 26.0–34.0)
MCHC: 33.9 g/dL (ref 30.0–36.0)
MCV: 108 fL — ABNORMAL HIGH (ref 80.0–100.0)
Monocytes Absolute: 0.3 10*3/uL (ref 0.1–1.0)
Monocytes Relative: 4 %
Neutro Abs: 6 10*3/uL (ref 1.7–7.7)
Neutrophils Relative %: 83 %
Platelets: 167 10*3/uL (ref 150–400)
RBC: 4.12 MIL/uL — ABNORMAL LOW (ref 4.22–5.81)
RDW: 15.6 % — ABNORMAL HIGH (ref 11.5–15.5)
WBC: 7.3 10*3/uL (ref 4.0–10.5)
nRBC: 0 % (ref 0.0–0.2)

## 2018-11-10 LAB — ABO/RH: ABO/RH(D): O POS

## 2018-11-10 LAB — COMPREHENSIVE METABOLIC PANEL
ALT: 27 U/L (ref 0–44)
AST: 36 U/L (ref 15–41)
Albumin: 4.7 g/dL (ref 3.5–5.0)
Alkaline Phosphatase: 56 U/L (ref 38–126)
Anion gap: 10 (ref 5–15)
BUN: 22 mg/dL — ABNORMAL HIGH (ref 6–20)
CO2: 26 mmol/L (ref 22–32)
Calcium: 10.2 mg/dL (ref 8.9–10.3)
Chloride: 101 mmol/L (ref 98–111)
Creatinine, Ser: 0.77 mg/dL (ref 0.61–1.24)
GFR calc Af Amer: >60 mL/min (ref >60)
GFR calc non Af Amer: >60 mL/min (ref >60)
Glucose, Bld: 111 mg/dL — ABNORMAL HIGH (ref 70–99)
Potassium: 4 mmol/L (ref 3.5–5.1)
Sodium: 137 mmol/L (ref 135–145)
Total Bilirubin: 0.9 mg/dL (ref 0.3–1.2)
Total Protein: 8 g/dL (ref 6.5–8.1)

## 2018-11-10 LAB — PROTIME-INR
INR: 0.9 (ref 0.8–1.2)
Prothrombin Time: 12 seconds (ref 11.4–15.2)

## 2018-11-10 LAB — APTT: aPTT: 25 seconds (ref 24–36)

## 2018-11-10 MED ORDER — CHLORHEXIDINE GLUCONATE 4 % EX LIQD
60.0000 mL | Freq: Once | CUTANEOUS | Status: DC
  Filled 2018-11-10: qty 60

## 2018-11-10 NOTE — Progress Notes (Signed)
PCP - none Cardiologist - n/a  Chest x-ray - n/a EKG - 11/10/2018 Stress Test - n/a ECHO - n/a Cardiac Cath - n/a  Sleep Study - n/a CPAP -   Fasting Blood Sugar - n/a Checks Blood Sugar _____ times a day  Blood Thinner Instructions:NONE Aspirin Instructions: Last Dose:  Anesthesia review: No review needed.  Patient denies shortness of breath, fever, cough and chest pain at PAT appointment   Patient verbalized understanding of instructions that were given to them at the PAT appointment. Patient was also instructed that they will need to review over the PAT instructions again at home before surgery.

## 2018-11-10 NOTE — Progress Notes (Signed)
SPOKE W/  _[name]     SCREENING SYMPTOMS OF COVID 19:   COUGH--NO  RUNNY NOSE--- NO  SORE THROAT---NO  NASAL CONGESTION----NO  SNEEZING----NO  SHORTNESS OF BREATH---NO  DIFFICULTY BREATHING---NO TEMP >100.0 -----NO  UNEXPLAINED BODY ACHES------NO  CHILLS -------- NO  HEADACHES ---------NO  LOSS OF SMELL/ TASTE --------NO    HAVE YOU OR ANY FAMILY MEMBER TRAVELLED PAST 14 DAYS OUT OF THE   COUNTY---NO STATE----NO COUNTRY----NO  HAVE YOU OR ANY FAMILY MEMBER BEEN EXPOSED TO ANYONE WITH COVID 19? NO

## 2018-11-11 ENCOUNTER — Other Ambulatory Visit (HOSPITAL_COMMUNITY)
Admission: RE | Admit: 2018-11-11 | Discharge: 2018-11-11 | Disposition: A | Discharge status: Home / Self Care | Payer: 59 | Source: Ambulatory Visit | Attending: Orthopedic Surgery | Admitting: Orthopedic Surgery

## 2018-11-11 DIAGNOSIS — Z20828 Contact with and (suspected) exposure to other viral communicable diseases: Secondary | ICD-10-CM | POA: Diagnosis not present

## 2018-11-11 DIAGNOSIS — Z01812 Encounter for preprocedural laboratory examination: Secondary | ICD-10-CM | POA: Insufficient documentation

## 2018-11-12 LAB — NOVEL CORONAVIRUS, NAA (HOSP ORDER, SEND-OUT TO REF LAB; TAT 18-24 HRS): SARS-CoV-2, NAA: NOT DETECTED

## 2018-11-14 MED ORDER — BUPIVACAINE LIPOSOME 1.3 % IJ SUSP
20.0000 mL | Freq: Once | INTRAMUSCULAR | Status: DC
  Filled 2018-11-14: qty 20

## 2018-11-14 NOTE — H&P (Signed)
Gerald Maxwell is an 53 y.o. male.   Chief Complaint: low back pain HPI: The patient is a 53 year old male who presented to the office with the chief complaint of low back pain with radiation of pain into the left LE. He did not improve with conservative treatments including NSAIDs, corticosteroids, and chiropractor visits. CT myelogram showed lumbar spinal stenosis and disc herniation L5-S1 with degenerative changes at L4-L5 as well.   Past Medical History:  Diagnosis Date  . Gout   . High cholesterol   . Hypertension   . Stomach ulcer     Past Surgical History:  Procedure Laterality Date  . APPENDECTOMY    . KNEE ARTHROSCOPY Bilateral   . UMBILICAL HERNIA REPAIR      No family history on file. Social History:  reports that he has been smoking. He has been smoking about 0.25 packs per day. He has never used smokeless tobacco. He reports current alcohol use of about 12.0 standard drinks of alcohol per week. He reports that he does not use drugs.  Allergies: No Known Allergies   Current Facility-Administered Medications:  .  [START ON 11/15/2018] bupivacaine liposome (EXPAREL) 1.3 % injection 266 mg, 20 mL, Infiltration, Once, Gerald Maudlin, MD  Current Outpatient Medications:  .  allopurinol (ZYLOPRIM) 300 MG tablet, Take 300 mg by mouth daily., Disp: , Rfl:  .  chlorthalidone (HYGROTON) 25 MG tablet, Take 25 mg by mouth daily., Disp: , Rfl:  .  famotidine (PEPCID) 20 MG tablet, Take 20 mg by mouth 2 (two) times daily., Disp: , Rfl:  .  gabapentin (NEURONTIN) 300 MG capsule, Take 300 mg by mouth 2 (two) times daily., Disp: , Rfl:  .  Multiple Vitamin (MULTIVITAMIN WITH MINERALS) TABS tablet, Take 1 tablet by mouth daily. Men's One A Day, Disp: , Rfl:  .  predniSONE (DELTASONE) 10 MG tablet, Take 10 mg by mouth daily with breakfast., Disp: , Rfl:  .  quinapril (ACCUPRIL) 40 MG tablet, Take 40 mg by mouth daily. , Disp: , Rfl:  .  verapamil (CALAN-SR) 240 MG CR tablet, Take 240  mg by mouth daily. , Disp: , Rfl:  .  vitamin B-12 (CYANOCOBALAMIN) 1000 MCG tablet, Take 1,000 mcg by mouth daily., Disp: , Rfl:   Review of Systems  Constitutional: Negative.   HENT: Negative.   Eyes: Negative.   Respiratory: Negative.   Cardiovascular: Negative.   Gastrointestinal: Negative.   Genitourinary: Negative.   Musculoskeletal: Positive for back pain, joint pain and myalgias. Negative for falls and neck pain.  Skin: Negative.   Neurological: Positive for weakness. Negative for dizziness, tingling, tremors, sensory change, speech change, focal weakness, seizures, loss of consciousness and headaches.  Endo/Heme/Allergies: Negative.   Psychiatric/Behavioral: Negative.    Vitals BP: 132/74 standing L arm Pulse: 80 bpm  Physical Exam  Constitutional: He is oriented to person, place, and time. He appears well-developed and well-nourished. No distress.  HENT:  Head: Normocephalic and atraumatic.  Right Ear: External ear normal.  Left Ear: External ear normal.  Nose: Nose normal.  Mouth/Throat: Oropharynx is clear and moist.  Eyes: Conjunctivae and EOM are normal.  Neck: Normal range of motion. Neck supple.  Cardiovascular: Normal rate, regular rhythm, normal heart sounds and intact distal pulses.  No murmur heard. Respiratory: Effort normal and breath sounds normal. No respiratory distress. He has no wheezes.  GI: Soft. Bowel sounds are normal. He exhibits no distension. There is no abdominal tenderness.  Musculoskeletal:  Right hip: Normal.     Left hip: Normal.     Right knee: Normal.     Left knee: Normal.     Lumbar back: He exhibits pain and spasm. He exhibits no bony tenderness.     Comments: Pain with motion of the lumbar spine which causes pain into the left LE. Positive SLR on the left leg.   Neurological: He is alert and oriented to person, place, and time. He has normal strength and normal reflexes. No sensory deficit.  Skin: No rash noted. He is not  diaphoretic. No erythema.  Psychiatric: He has a normal mood and affect. His behavior is normal.     Assessment/Plan Lumbar spinal stenosis L4-L5, L5-S1 with disc herniation L5-S1 leftHe needs a central decompression lumbar laminectomy L5-S1, microdiscectomy L5-S1 left, as well as exploration L4-L5 left. Risks and benefits of the procedure discussed with the patient by Dr. Darrelyn Maxwell. Will stay overnight for observation.    H&P performed by Dr. Ranee Maxwell Documented by Gerald Ped, PA-C  Gerald Maxwell Gerald Lovett, PA-C 11/14/2018, 8:39 PM

## 2018-11-14 NOTE — Anesthesia Preprocedure Evaluation (Addendum)
Anesthesia Evaluation  Patient identified by MRN, date of birth, ID band Patient awake    Reviewed: Allergy & Precautions, NPO status , Patient's Chart, lab work & pertinent test results  History of Anesthesia Complications Negative for: history of anesthetic complications  Airway Mallampati: II  TM Distance: >3 FB Neck ROM: Full    Dental  (+) Missing   Pulmonary neg pulmonary ROS, Current Smoker,    Pulmonary exam normal        Cardiovascular hypertension, Pt. on medications Normal cardiovascular exam     Neuro/Psych negative neurological ROS  negative psych ROS   GI/Hepatic Neg liver ROS, PUD,   Endo/Other  negative endocrine ROS  Renal/GU negative Renal ROS  negative genitourinary   Musculoskeletal  (+) Arthritis ,   Abdominal   Peds  Hematology negative hematology ROS (+)   Anesthesia Other Findings   Reproductive/Obstetrics                            Anesthesia Physical Anesthesia Plan  ASA: II  Anesthesia Plan: General   Post-op Pain Management:    Induction: Intravenous  PONV Risk Score and Plan: 1 and Ondansetron, Dexamethasone, Treatment may vary due to age or medical condition and Midazolam  Airway Management Planned: Oral ETT  Additional Equipment: None  Intra-op Plan:   Post-operative Plan: Extubation in OR  Informed Consent: I have reviewed the patients History and Physical, chart, labs and discussed the procedure including the risks, benefits and alternatives for the proposed anesthesia with the patient or authorized representative who has indicated his/her understanding and acceptance.     Dental advisory given  Plan Discussed with:   Anesthesia Plan Comments:        Anesthesia Quick Evaluation

## 2018-11-15 ENCOUNTER — Ambulatory Visit (HOSPITAL_COMMUNITY): Payer: 59 | Admitting: Physician Assistant

## 2018-11-15 ENCOUNTER — Ambulatory Visit (HOSPITAL_COMMUNITY): Payer: 59

## 2018-11-15 ENCOUNTER — Encounter (HOSPITAL_COMMUNITY): Payer: Self-pay | Admitting: *Deleted

## 2018-11-15 ENCOUNTER — Observation Stay (HOSPITAL_COMMUNITY)
Admission: RE | Admit: 2018-11-15 | Discharge: 2018-11-16 | Disposition: A | Discharge status: Home / Self Care | Payer: 59 | Attending: Orthopedic Surgery | Admitting: Orthopedic Surgery

## 2018-11-15 ENCOUNTER — Encounter (HOSPITAL_COMMUNITY)
Admission: RE | Disposition: A | Discharge status: Home / Self Care | Payer: Self-pay | Source: Home / Self Care | Attending: Orthopedic Surgery

## 2018-11-15 ENCOUNTER — Ambulatory Visit (HOSPITAL_COMMUNITY): Payer: 59 | Admitting: Anesthesiology

## 2018-11-15 ENCOUNTER — Other Ambulatory Visit: Payer: Self-pay

## 2018-11-15 DIAGNOSIS — F17210 Nicotine dependence, cigarettes, uncomplicated: Secondary | ICD-10-CM | POA: Insufficient documentation

## 2018-11-15 DIAGNOSIS — M5127 Other intervertebral disc displacement, lumbosacral region: Secondary | ICD-10-CM | POA: Diagnosis present

## 2018-11-15 DIAGNOSIS — Z7952 Long term (current) use of systemic steroids: Secondary | ICD-10-CM | POA: Insufficient documentation

## 2018-11-15 DIAGNOSIS — M109 Gout, unspecified: Secondary | ICD-10-CM | POA: Insufficient documentation

## 2018-11-15 DIAGNOSIS — M48062 Spinal stenosis, lumbar region with neurogenic claudication: Secondary | ICD-10-CM | POA: Diagnosis present

## 2018-11-15 DIAGNOSIS — Z79899 Other long term (current) drug therapy: Secondary | ICD-10-CM | POA: Insufficient documentation

## 2018-11-15 DIAGNOSIS — I10 Essential (primary) hypertension: Secondary | ICD-10-CM | POA: Insufficient documentation

## 2018-11-15 DIAGNOSIS — M4807 Spinal stenosis, lumbosacral region: Secondary | ICD-10-CM | POA: Insufficient documentation

## 2018-11-15 DIAGNOSIS — M549 Dorsalgia, unspecified: Secondary | ICD-10-CM

## 2018-11-15 HISTORY — PX: LUMBAR LAMINECTOMY/DECOMPRESSION MICRODISCECTOMY: SHX5026

## 2018-11-15 LAB — TYPE AND SCREEN
ABO/RH(D): O POS
Antibody Screen: NEGATIVE

## 2018-11-15 SURGERY — LUMBAR LAMINECTOMY/DECOMPRESSION MICRODISCECTOMY
Anesthesia: General | Site: Back

## 2018-11-15 MED ORDER — FENTANYL CITRATE (PF) 100 MCG/2ML IJ SOLN
25.0000 ug | INTRAMUSCULAR | Status: DC | PRN
  Administered 2018-11-15 (×2): 50 ug via INTRAVENOUS

## 2018-11-15 MED ORDER — MENTHOL 3 MG MT LOZG: 1.0000 | LOZENGE | OROMUCOSAL | Status: DC | PRN

## 2018-11-15 MED ORDER — MIDAZOLAM HCL 5 MG/5ML IJ SOLN
INTRAMUSCULAR | Status: DC | PRN
  Administered 2018-11-15: 2 mg via INTRAVENOUS

## 2018-11-15 MED ORDER — SUGAMMADEX SODIUM 200 MG/2ML IV SOLN
INTRAVENOUS | Status: DC | PRN
  Administered 2018-11-15: 200 mg via INTRAVENOUS

## 2018-11-15 MED ORDER — BUPIVACAINE-EPINEPHRINE 0.25% -1:200000 IJ SOLN
INTRAMUSCULAR | Status: AC
  Filled 2018-11-15: qty 1

## 2018-11-15 MED ORDER — SUCCINYLCHOLINE CHLORIDE 200 MG/10ML IV SOSY
PREFILLED_SYRINGE | INTRAVENOUS | Status: AC
  Filled 2018-11-15: qty 30

## 2018-11-15 MED ORDER — ACETAMINOPHEN 650 MG RE SUPP: 650.0000 mg | RECTAL | Status: DC | PRN

## 2018-11-15 MED ORDER — ALLOPURINOL 300 MG PO TABS
300.0000 mg | ORAL_TABLET | Freq: Every day | ORAL | Status: DC
  Administered 2018-11-16: 300 mg via ORAL
  Filled 2018-11-15: qty 1

## 2018-11-15 MED ORDER — HYDROMORPHONE HCL 1 MG/ML IJ SOLN: 0.5000 mg | INTRAMUSCULAR | Status: DC | PRN

## 2018-11-15 MED ORDER — LIDOCAINE 2% (20 MG/ML) 5 ML SYRINGE
INTRAMUSCULAR | Status: DC | PRN
  Administered 2018-11-15: 60 mg via INTRAVENOUS

## 2018-11-15 MED ORDER — DEXAMETHASONE SODIUM PHOSPHATE 10 MG/ML IJ SOLN
INTRAMUSCULAR | Status: DC | PRN
  Administered 2018-11-15: 5 mg via INTRAVENOUS

## 2018-11-15 MED ORDER — SODIUM CHLORIDE 0.9 % IV SOLN
INTRAVENOUS | Status: AC
  Filled 2018-11-15: qty 500000

## 2018-11-15 MED ORDER — ONDANSETRON HCL 4 MG/2ML IJ SOLN: 4.0000 mg | Freq: Once | INTRAMUSCULAR | Status: DC | PRN

## 2018-11-15 MED ORDER — ONDANSETRON HCL 4 MG/2ML IJ SOLN: 4.0000 mg | Freq: Four times a day (QID) | INTRAMUSCULAR | Status: DC | PRN

## 2018-11-15 MED ORDER — PHENOL 1.4 % MT LIQD: 1.0000 | OROMUCOSAL | Status: DC | PRN

## 2018-11-15 MED ORDER — MIDAZOLAM HCL 2 MG/2ML IJ SOLN
INTRAMUSCULAR | Status: AC
  Filled 2018-11-15: qty 2

## 2018-11-15 MED ORDER — OXYCODONE HCL 5 MG/5ML PO SOLN: 5.0000 mg | Freq: Once | ORAL | Status: AC | PRN

## 2018-11-15 MED ORDER — SODIUM CHLORIDE 0.9 % IV SOLN
INTRAVENOUS | Status: DC | PRN
  Administered 2018-11-15: 500 mL

## 2018-11-15 MED ORDER — THROMBIN 5000 UNITS EX SOLR
CUTANEOUS | Status: DC | PRN
  Administered 2018-11-15: 5000 [IU] via TOPICAL

## 2018-11-15 MED ORDER — ACETAMINOPHEN 325 MG PO TABS: 650.0000 mg | ORAL_TABLET | ORAL | Status: DC | PRN

## 2018-11-15 MED ORDER — LACTATED RINGERS IV SOLN: INTRAVENOUS | Status: DC

## 2018-11-15 MED ORDER — HYDROCODONE-ACETAMINOPHEN 10-325 MG PO TABS: 1.0000 | ORAL_TABLET | ORAL | Status: DC | PRN

## 2018-11-15 MED ORDER — GABAPENTIN 300 MG PO CAPS
300.0000 mg | ORAL_CAPSULE | Freq: Two times a day (BID) | ORAL | Status: DC
  Administered 2018-11-15 – 2018-11-16 (×2): 300 mg via ORAL
  Filled 2018-11-15 (×2): qty 1

## 2018-11-15 MED ORDER — VERAPAMIL HCL ER 240 MG PO TBCR
240.0000 mg | EXTENDED_RELEASE_TABLET | Freq: Every day | ORAL | Status: DC
  Administered 2018-11-15: 240 mg via ORAL
  Filled 2018-11-15 (×2): qty 1

## 2018-11-15 MED ORDER — FENTANYL CITRATE (PF) 250 MCG/5ML IJ SOLN
INTRAMUSCULAR | Status: AC
  Filled 2018-11-15: qty 5

## 2018-11-15 MED ORDER — SUCCINYLCHOLINE CHLORIDE 20 MG/ML IJ SOLN
INTRAMUSCULAR | Status: DC | PRN
  Administered 2018-11-15: 120 mg via INTRAVENOUS

## 2018-11-15 MED ORDER — CEFAZOLIN SODIUM-DEXTROSE 2-4 GM/100ML-% IV SOLN
2.0000 g | INTRAVENOUS | Status: AC
  Administered 2018-11-15: 09:00:00 2 g via INTRAVENOUS
  Filled 2018-11-15: qty 100

## 2018-11-15 MED ORDER — FAMOTIDINE 20 MG PO TABS
20.0000 mg | ORAL_TABLET | Freq: Two times a day (BID) | ORAL | Status: DC
  Administered 2018-11-15 – 2018-11-16 (×2): 20 mg via ORAL
  Filled 2018-11-15 (×2): qty 1

## 2018-11-15 MED ORDER — FLEET ENEMA 7-19 GM/118ML RE ENEM: 1.0000 | ENEMA | Freq: Once | RECTAL | Status: DC | PRN

## 2018-11-15 MED ORDER — METHOCARBAMOL 500 MG PO TABS
500.0000 mg | ORAL_TABLET | Freq: Four times a day (QID) | ORAL | Status: DC | PRN
  Administered 2018-11-15: 21:00:00 500 mg via ORAL
  Filled 2018-11-15: qty 1

## 2018-11-15 MED ORDER — HYDROCODONE-ACETAMINOPHEN 5-325 MG PO TABS
1.0000 | ORAL_TABLET | ORAL | Status: DC | PRN
  Administered 2018-11-15 (×2): 1 via ORAL
  Filled 2018-11-15 (×2): qty 1

## 2018-11-15 MED ORDER — QUINAPRIL HCL 10 MG PO TABS: 40.0000 mg | ORAL_TABLET | Freq: Every day | ORAL | Status: DC

## 2018-11-15 MED ORDER — LACTATED RINGERS IV SOLN
INTRAVENOUS | Status: DC
  Administered 2018-11-15: 12:00:00 via INTRAVENOUS

## 2018-11-15 MED ORDER — BISACODYL 5 MG PO TBEC: 5.0000 mg | DELAYED_RELEASE_TABLET | Freq: Every day | ORAL | Status: DC | PRN

## 2018-11-15 MED ORDER — PROPOFOL 10 MG/ML IV BOLUS
INTRAVENOUS | Status: DC | PRN
  Administered 2018-11-15: 60 mg via INTRAVENOUS
  Administered 2018-11-15: 140 mg via INTRAVENOUS

## 2018-11-15 MED ORDER — FENTANYL CITRATE (PF) 100 MCG/2ML IJ SOLN
INTRAMUSCULAR | Status: AC
  Filled 2018-11-15: qty 2

## 2018-11-15 MED ORDER — CHLORTHALIDONE 25 MG PO TABS
25.0000 mg | ORAL_TABLET | Freq: Every day | ORAL | Status: DC
  Administered 2018-11-15 – 2018-11-16 (×2): 25 mg via ORAL
  Filled 2018-11-15 (×2): qty 1

## 2018-11-15 MED ORDER — METHOCARBAMOL 500 MG IVPB - SIMPLE MED
INTRAVENOUS | Status: AC
  Filled 2018-11-15: qty 50

## 2018-11-15 MED ORDER — OXYCODONE HCL 5 MG PO TABS
5.0000 mg | ORAL_TABLET | Freq: Once | ORAL | Status: AC | PRN
  Administered 2018-11-15: 5 mg via ORAL

## 2018-11-15 MED ORDER — THROMBIN (RECOMBINANT) 5000 UNITS EX SOLR
CUTANEOUS | Status: AC
  Filled 2018-11-15: qty 5000

## 2018-11-15 MED ORDER — FENTANYL CITRATE (PF) 100 MCG/2ML IJ SOLN
INTRAMUSCULAR | Status: DC | PRN
  Administered 2018-11-15 (×2): 100 ug via INTRAVENOUS
  Administered 2018-11-15: 50 ug via INTRAVENOUS

## 2018-11-15 MED ORDER — BACITRACIN-NEOMYCIN-POLYMYXIN OINTMENT TUBE
TOPICAL_OINTMENT | CUTANEOUS | Status: AC
  Filled 2018-11-15: qty 14.17

## 2018-11-15 MED ORDER — METHOCARBAMOL 500 MG IVPB - SIMPLE MED
500.0000 mg | Freq: Four times a day (QID) | INTRAVENOUS | Status: DC | PRN
  Administered 2018-11-15: 500 mg via INTRAVENOUS
  Filled 2018-11-15: qty 50

## 2018-11-15 MED ORDER — POLYETHYLENE GLYCOL 3350 17 G PO PACK: 17.0000 g | PACK | Freq: Every day | ORAL | Status: DC | PRN

## 2018-11-15 MED ORDER — ROCURONIUM BROMIDE 10 MG/ML (PF) SYRINGE
PREFILLED_SYRINGE | INTRAVENOUS | Status: DC | PRN
  Administered 2018-11-15: 40 mg via INTRAVENOUS
  Administered 2018-11-15: 10 mg via INTRAVENOUS
  Administered 2018-11-15: 20 mg via INTRAVENOUS

## 2018-11-15 MED ORDER — ONDANSETRON HCL 4 MG/2ML IJ SOLN
INTRAMUSCULAR | Status: DC | PRN
  Administered 2018-11-15: 4 mg via INTRAVENOUS

## 2018-11-15 MED ORDER — LISINOPRIL 20 MG PO TABS
40.0000 mg | ORAL_TABLET | Freq: Every day | ORAL | Status: DC
  Administered 2018-11-15: 18:00:00 40 mg via ORAL
  Filled 2018-11-15 (×2): qty 2

## 2018-11-15 MED ORDER — BUPIVACAINE LIPOSOME 1.3 % IJ SUSP
INTRAMUSCULAR | Status: DC | PRN
  Administered 2018-11-15: 20 mL

## 2018-11-15 MED ORDER — ONDANSETRON HCL 4 MG PO TABS: 4.0000 mg | ORAL_TABLET | Freq: Four times a day (QID) | ORAL | Status: DC | PRN

## 2018-11-15 MED ORDER — LACTATED RINGERS IV SOLN
INTRAVENOUS | Status: DC
  Administered 2018-11-15 (×2): via INTRAVENOUS

## 2018-11-15 MED ORDER — OXYCODONE HCL 5 MG PO TABS
ORAL_TABLET | ORAL | Status: AC
  Filled 2018-11-15: qty 1

## 2018-11-15 MED ORDER — PROPOFOL 10 MG/ML IV BOLUS
INTRAVENOUS | Status: AC
  Filled 2018-11-15: qty 20

## 2018-11-15 MED ORDER — OXYCODONE HCL 5 MG PO TABS
5.0000 mg | ORAL_TABLET | Freq: Four times a day (QID) | ORAL | Status: DC | PRN
  Administered 2018-11-16: 06:00:00 5 mg via ORAL
  Filled 2018-11-15 (×3): qty 1

## 2018-11-15 MED ORDER — BACITRACIN-NEOMYCIN-POLYMYXIN 400-5-5000 EX OINT
TOPICAL_OINTMENT | CUTANEOUS | Status: DC | PRN
  Administered 2018-11-15: 1 via TOPICAL

## 2018-11-15 MED ORDER — CEFAZOLIN SODIUM-DEXTROSE 1-4 GM/50ML-% IV SOLN
1.0000 g | Freq: Three times a day (TID) | INTRAVENOUS | Status: AC
  Administered 2018-11-15 – 2018-11-16 (×3): 1 g via INTRAVENOUS
  Filled 2018-11-15 (×3): qty 50

## 2018-11-15 MED ORDER — BUPIVACAINE-EPINEPHRINE 0.25% -1:200000 IJ SOLN
INTRAMUSCULAR | Status: DC | PRN
  Administered 2018-11-15: 20 mL

## 2018-11-15 MED ORDER — ONDANSETRON HCL 4 MG/2ML IJ SOLN
INTRAMUSCULAR | Status: AC
  Filled 2018-11-15: qty 2

## 2018-11-15 SURGICAL SUPPLY — 45 items
AGENT HMST SPONGE THK3/8 (HEMOSTASIS) ×1
APL SKNCLS STERI-STRIP NONHPOA (GAUZE/BANDAGES/DRESSINGS) ×1
BAG SPEC THK2 15X12 ZIP CLS (MISCELLANEOUS)
BAG ZIPLOCK 12X15 (MISCELLANEOUS) IMPLANT
BENZOIN TINCTURE PRP APPL 2/3 (GAUZE/BANDAGES/DRESSINGS) ×3 IMPLANT
CLEANER TIP ELECTROSURG 2X2 (MISCELLANEOUS) ×3 IMPLANT
COVER SURGICAL LIGHT HANDLE (MISCELLANEOUS) ×3 IMPLANT
COVER WAND RF STERILE (DRAPES) IMPLANT
DRAPE MICROSCOPE LEICA (MISCELLANEOUS) ×3 IMPLANT
DRAPE SHEET LG 3/4 BI-LAMINATE (DRAPES) ×3 IMPLANT
DRAPE SURG 17X11 SM STRL (DRAPES) ×3 IMPLANT
DRSG ADAPTIC 3X8 NADH LF (GAUZE/BANDAGES/DRESSINGS) ×3 IMPLANT
DRSG PAD ABDOMINAL 8X10 ST (GAUZE/BANDAGES/DRESSINGS) ×9 IMPLANT
DURAPREP 26ML APPLICATOR (WOUND CARE) ×3 IMPLANT
ELECT BLADE TIP CTD 4 INCH (ELECTRODE) ×3 IMPLANT
ELECT REM PT RETURN 15FT ADLT (MISCELLANEOUS) ×3 IMPLANT
GAUZE SPONGE 4X4 12PLY STRL (GAUZE/BANDAGES/DRESSINGS) ×3 IMPLANT
GLOVE BIOGEL PI IND STRL 6.5 (GLOVE) ×1 IMPLANT
GLOVE BIOGEL PI IND STRL 8.5 (GLOVE) ×1 IMPLANT
GLOVE BIOGEL PI INDICATOR 6.5 (GLOVE) ×2
GLOVE BIOGEL PI INDICATOR 8.5 (GLOVE) ×2
GLOVE ECLIPSE 8.0 STRL XLNG CF (GLOVE) ×6 IMPLANT
GLOVE SURG SS PI 6.5 STRL IVOR (GLOVE) ×3 IMPLANT
GOWN STRL REUS W/ TWL LRG LVL3 (GOWN DISPOSABLE) ×1 IMPLANT
GOWN STRL REUS W/TWL LRG LVL3 (GOWN DISPOSABLE) ×3
GOWN STRL REUS W/TWL XL LVL3 (GOWN DISPOSABLE) ×3 IMPLANT
HEMOSTAT SPONGE AVITENE ULTRA (HEMOSTASIS) ×3 IMPLANT
KIT BASIN OR (CUSTOM PROCEDURE TRAY) ×3 IMPLANT
KIT TURNOVER KIT A (KITS) IMPLANT
MANIFOLD NEPTUNE II (INSTRUMENTS) ×3 IMPLANT
NEEDLE HYPO 22GX1.5 SAFETY (NEEDLE) ×3 IMPLANT
NEEDLE SPNL 18GX3.5 QUINCKE PK (NEEDLE) ×6 IMPLANT
PACK LAMINECTOMY ORTHO (CUSTOM PROCEDURE TRAY) ×3 IMPLANT
PATTIES SURGICAL .5 X.5 (GAUZE/BANDAGES/DRESSINGS) IMPLANT
PATTIES SURGICAL .75X.75 (GAUZE/BANDAGES/DRESSINGS) ×3 IMPLANT
PATTIES SURGICAL 1X1 (DISPOSABLE) IMPLANT
SPONGE LAP 4X18 RFD (DISPOSABLE) ×6 IMPLANT
STAPLER VISISTAT 35W (STAPLE) ×3 IMPLANT
SUT VIC AB 1 CT1 27 (SUTURE) ×6
SUT VIC AB 1 CT1 27XBRD ANTBC (SUTURE) ×2 IMPLANT
SUT VIC AB 2-0 CT1 27 (SUTURE) ×6
SUT VIC AB 2-0 CT1 TAPERPNT 27 (SUTURE) ×2 IMPLANT
SYR 20ML LL LF (SYRINGE) ×3 IMPLANT
TOWEL OR 17X26 10 PK STRL BLUE (TOWEL DISPOSABLE) ×3 IMPLANT
TOWEL OR NON WOVEN STRL DISP B (DISPOSABLE) ×3 IMPLANT

## 2018-11-15 NOTE — Interval H&P Note (Signed)
History and Physical Interval Note:  11/15/2018 8:06 AM  Gerald Maxwell  has presented today for surgery, with the diagnosis of Lumbar spinal stenosis, herniated nucleus polposus L5-S1 left.  The various methods of treatment have been discussed with the patient and family. After consideration of risks, benefits and other options for treatment, the patient has consented to  Procedure(s) with comments: Central decompression lumbar laminectomy L5-S1, microdiscectomy L5-S1 left, Explore L4-L5 left (N/A) - 63min as a surgical intervention.  The patient's history has been reviewed, patient examined, no change in status, stable for surgery.  I have reviewed the patient's chart and labs.  Questions were answered to the patient's satisfaction.     Latanya Maudlin

## 2018-11-15 NOTE — Discharge Instructions (Signed)

## 2018-11-15 NOTE — Anesthesia Procedure Notes (Signed)
Procedure Name: Intubation Performed by: Dalon Reichart J, CRNA Pre-anesthesia Checklist: Patient identified, Emergency Drugs available, Suction available, Patient being monitored and Timeout performed Patient Re-evaluated:Patient Re-evaluated prior to induction Oxygen Delivery Method: Circle system utilized Preoxygenation: Pre-oxygenation with 100% oxygen Induction Type: IV induction Ventilation: Mask ventilation without difficulty Laryngoscope Size: Mac and 4 Grade View: Grade I Tube type: Oral Tube size: 7.5 mm Number of attempts: 1 Airway Equipment and Method: Stylet Placement Confirmation: ETT inserted through vocal cords under direct vision,  positive ETCO2 and breath sounds checked- equal and bilateral Secured at: 23 cm Tube secured with: Tape Dental Injury: Teeth and Oropharynx as per pre-operative assessment        

## 2018-11-15 NOTE — Anesthesia Postprocedure Evaluation (Signed)
Anesthesia Post Note  Patient: Gerald Maxwell  Procedure(s) Performed: Central decompression lumbar laminectomy spinal stenosis L5-S1, microdiscectomy L5-S1 left, foramonotomy L5-S1 nerve root left (N/A Back)     Patient location during evaluation: PACU Anesthesia Type: General Level of consciousness: awake and alert Pain management: pain level controlled Vital Signs Assessment: post-procedure vital signs reviewed and stable Respiratory status: spontaneous breathing, nonlabored ventilation and respiratory function stable Cardiovascular status: blood pressure returned to baseline and stable Postop Assessment: no apparent nausea or vomiting Anesthetic complications: no    Last Vitals:  Vitals:   11/15/18 1100 11/15/18 1115  BP: 138/82 130/83  Pulse: 67 70  Resp: 13 12  Temp: 36.7 C   SpO2: 99% 97%    Last Pain:  Vitals:   11/15/18 1100  TempSrc:   PainSc: 4                  Lidia Collum

## 2018-11-15 NOTE — Transfer of Care (Signed)
Immediate Anesthesia Transfer of Care Note  Patient: Gerald Maxwell  Procedure(s) Performed: Central decompression lumbar laminectomy spinal stenosis L5-S1, microdiscectomy L5-S1 left, foramonotomy L5-S1 nerve root left (N/A Back)  Patient Location: PACU  Anesthesia Type:General  Level of Consciousness: awake, alert  and oriented  Airway & Oxygen Therapy: Patient Spontanous Breathing and Patient connected to face mask oxygen  Post-op Assessment: Report given to RN and Post -op Vital signs reviewed and stable  Post vital signs: Reviewed and stable  Last Vitals:  Vitals Value Taken Time  BP 145/97 11/15/18 1019  Temp    Pulse 80 11/15/18 1022  Resp 17 11/15/18 1022  SpO2 100 % 11/15/18 1022  Vitals shown include unvalidated device data.  Last Pain:  Vitals:   11/15/18 1696  TempSrc: Oral         Complications: No apparent anesthesia complications

## 2018-11-15 NOTE — Brief Op Note (Signed)
11/15/2018  10:19 AM  PATIENT:  Gerald Maxwell  53 y.o. male  PRE-OPERATIVE DIAGNOSIS:  Lumbar spinal stenosis, herniated nucleus polposus L5-S1 left and Foraminal Stenosis involving the L-5 and-1 on the left.  POST-OPERATIVE DIAGNOSIS: Same as Pre-OP  PROCEDURE:  Procedure(s) with comments: Central decompression lumbar laminectomy spinal stenosis L5-S1, microdiscectomy L5-S1 left, foramonotomy L5-S1 nerve root left (N/A) - 41min,Both Roots,for Foraminal Stenosis.  SURGEON:  Surgeon(s) and Role:    Latanya Maudlin, MD - Primary  PHYSICIAN ASSISTANT:Amber Effie PA   ASSISTANTS:Amber [place] PA   ANESTHESIA:   general  EBL:  25 mL   BLOOD ADMINISTERED:none  DRAINS: none   LOCAL MEDICATIONS USED:  MARCAINE 20cc of 0.25% with Epinephrine at the start of the case and 20cc of Exparel at the end of the case.    SPECIMEN:  No Specimen  DISPOSITION OF SPECIMEN:  N/A  COUNTS:  YES  TOURNIQUET:  * No tourniquets in log *  DICTATION: .Other Dictation: Dictation Number [id number]  PLAN OF CARE: Admit for overnight observation  PATIENT DISPOSITION:  PACU - hemodynamically stable.   Delay start of Pharmacological VTE agent (>24hrs) due to surgical blood loss or risk of bleeding: yes

## 2018-11-15 NOTE — Op Note (Signed)
NAME: Gerald Maxwell, Gerald Maxwell MEDICAL RECORD [id number] ACCOUNT [id number] DATE OF BIRTH:06-13-1965 FACILITY: [facility] LOCATION: [id number] PHYSICIAN:Alize Acy Fransico Setters, MD  OPERATIVE REPORT  DATE OF PROCEDURE:  11/15/2018  SURGEON:  Latanya Maudlin, MD  ASSISTANT:  Ardeen Jourdain, PA  PREOPERATIVE DIAGNOSES:     1.  Spinal stenosis at L5-S1. 2.  Foraminal stenosis involving the L5 and the S1 roots on the left. 3.  Herniated lumbar disk at L5-S1 on the left.  OPERATION: 1.  Complete decompressive lumbar laminectomy at L5-S1. 2.  Microdiskectomy at L5-S1. 3.  Foraminotomy for the L5 root on the left. 4.  Foraminotomy for the S1 root on the left.    NOTE:  A little history, he had a myelogram and CT that showed a herniated disk with the block on the left at L5-S1.  He had about a grade 0.5 slip at L4-L5.  I did call the radiologist yesterday after reviewing his films and I was concerned about a  possible block at L4-L5.  The radiologist assured me that that was not the case because once they had him stand, the dye went totally through and that is compatible with his symptoms.  All his pain is over the buttocks, posterior thigh, posterior calf,  down to the bottom of his foot, which was along the S1 nerve root.  PROCEDURE:  Under general anesthesia with the patient on a Wilson frame, a routine orthopedic prep and draping of the back was carried out.  The appropriate time-out was carried out.  I also marked the appropriate left side where his symptoms were even  though we were going central.  At this time, 2 needles then were placed in the back for localization purposes.  X-ray was taken.  Once we selected the appropriate site for the incision, I injected 20 mL of 0.25% Marcaine with epinephrine to control the  soft tissue bleeding.  At this time, an incision was made over L5-S1.  I extended the incision proximally and distally.  I then stripped the muscle from the lamina and spinous processes  bilaterally.  Bleeders were identified and cauterized.  A Kocher  clamp was placed on the spinous process of L5.  X-ray was taken once again to verify the correct position.  Once this was identified, I then inserted the [facility] retractors.  I then completed the freeing up of the soft tissue from the lamina.  At this  particular point, I then utilized double action rongeur and did my decompressive laminectomy at L5-S1.  Another x-ray was taken with the instruments in place to make sure we were at L5-S1 and we were.  Following that, I completed the decompression.  I  went far out laterally and at left lateral recess, decompressed that as well until I could identify the S1 root.  Following this, I then was able to easily identify the S1 root.  I gently retracted the root, cauterized the lateral recess veins with a  bipolar.  I then made a cruciate incision in the posterior longitudinal ligament and did a microdiskectomy at L5-S1 on the left.  Once this was done, the nerve root was very free.  I could easily move it around like a piece of spaghetti.  I then went up  into the foramina above and below and did foraminotomies for the L5 and the S1 root.  Note, when the procedure was completed, I then took the hockey stick, went up proximally, centrally to make sure we did not have any blockage  up above and we did not.   I did the same distally.  We went out the foramina as well with the hockey stick.  So I was very happy.  We have completely freed up the root and the dura, thoroughly irrigated out the area, and loosely applied some thrombin-soaked Gelfoam.  At this  particular time, the wound was closed in the usual fashion.  At the end of the procedure, I injected 20 mL of Exparel into the soft tissue.  The skin then was closed with staples.  Sterile dressings were applied.  The patient left the operating room in  satisfactory condition.  He will be admitted overnight.  CN/NUANCE  D:11/15/2018 T:11/15/2018  JOB:[id number]

## 2018-11-15 NOTE — Evaluation (Signed)
Physical Therapy Evaluation Patient Details Name: Gerald Maxwell MRN: [id number] DOB: 1965-06-12 Today's Date: 11/15/2018   History of Present Illness  Patient is 53 y.o. male s/p L5-S1 decompressive lumbar laminectomy with microdiskectomy and Lt L5, S1 foraminotomy on 11/15/18 with PMH significant for HTN and bil knee arthroscopy.    Clinical Impression  [name] is a 53 y.o. male POD 0 s/p L5-S1 decompressive lumbar laminectomy with microdiskectomy and Lt L5, S1 foraminotomy. Patient reports independence with mobility at baseline. Patient is now limited by functional impairments (see PT problem list below) and requires supervision for transfers and gait with RW. Patient was able to ambulate ~160 feet with RW and no overt LOB was noted. Patient instructed in exercise for LE strengthening and for circulation. Patient will benefit from continued skilled PT interventions to address impairments and progress towards PLOF. Acute PT will follow to progress mobility and stair training in preparation for safe discharge home.    Follow Up Recommendations Follow surgeon's recommendation for DC plan and follow-up therapies    Equipment Recommendations  Rolling walker with 5" wheels    Recommendations for Other Services       Precautions / Restrictions Precautions Precautions: Fall;Back Precaution Booklet Issued: No Restrictions Weight Bearing Restrictions: No      Mobility  Bed Mobility Overal bed mobility: Needs Assistance Bed Mobility: Supine to Sit     Supine to sit: Supervision     General bed mobility comments: verbal cues log roll technique for bed mobility, no physical assist required  Transfers Overall transfer level: Needs assistance Equipment used: Rolling walker (2 wheeled) Transfers: Sit to/from Stand Sit to Stand: Supervision         General transfer comment: cues for safe hand placement and to maintain spinal precautions during  transfers  Ambulation/Gait Ambulation/Gait assistance: Supervision Gait Distance (Feet): 160 Feet Assistive device: Rolling walker (2 wheeled) Gait Pattern/deviations: WFL(Within Functional Limits) Gait velocity: WNL's   General Gait Details: no overt LOB noted during gait, pt demonstrated good safety with RW  Stairs            Wheelchair Mobility    Modified Rankin (Stroke Patients Only)       Balance Overall balance assessment: Mild deficits observed, not formally tested                  Pertinent Vitals/Pain Pain Assessment: 0-10 Pain Score: 5  Pain Location: lower back Pain Descriptors / Indicators: Aching;Sore Pain Intervention(s): Limited activity within patient's tolerance;Monitored during session;Repositioned    Home Living Family/patient expects to be discharged to:: Private residence Living Arrangements: Spouse/significant other Available Help at Discharge: Family Type of Home: House Home Access: Stairs to enter Entrance Stairs-Rails: Left Entrance Stairs-Number of Steps: 3 from garage Home Layout: Two level;Able to live on main level with bedroom/bathroom;Full bath on main level Home Equipment: Shower seat - built in;Cane - single point      Prior Function Level of Independence: Independent               Hand Dominance   Dominant Hand: Right    Extremity/Trunk Assessment   Upper Extremity Assessment Upper Extremity Assessment: Overall WFL for tasks assessed    Lower Extremity Assessment Lower Extremity Assessment: Overall WFL for tasks assessed;RLE deficits/detail;LLE deficits/detail RLE Deficits / Details: 4+/5 for MMT of leg extension and dorsiflexion RLE Sensation: WNL RLE Coordination: WNL LLE Deficits / Details: pt's sensation intact to light touch, pt reports some parasthesia in plantar surface  of Lt foot; 4+/5 for MMT of leg extension and dorsiflexion LLE Sensation: WNL LLE Coordination: WNL    Cervical / Trunk  Assessment Cervical / Trunk Assessment: Normal  Communication   Communication: No difficulties  Cognition Arousal/Alertness: Awake/alert Behavior During Therapy: WFL for tasks assessed/performed Overall Cognitive Status: Within Functional Limits for tasks assessed             General Comments      Exercises General Exercises - Lower Extremity Ankle Circles/Pumps: AROM;10 reps;Seated;Both Long Arc Quad: AROM;10 reps;Seated;Both   Assessment/Plan    PT Assessment Patient needs continued PT services  PT Problem List Decreased strength;Decreased balance;Decreased mobility;Decreased range of motion;Decreased activity tolerance;Decreased knowledge of use of DME       PT Treatment Interventions DME instruction;Functional mobility training;Gait training;Therapeutic activities;Therapeutic exercise;Stair training;Balance training;Patient/family education;Modalities    PT Goals (Current goals can be found in the Care Plan section)  Acute Rehab PT Goals Patient Stated Goal: to get back to walkign independently PT Goal Formulation: With patient Time For Goal Achievement: 11/22/18 Potential to Achieve Goals: Good    Frequency Min 5X/week    AM-PAC PT "6 Clicks" Mobility  Outcome Measure Help needed turning from your back to your side while in a flat bed without using bedrails?: A Little Help needed moving from lying on your back to sitting on the side of a flat bed without using bedrails?: A Little Help needed moving to and from a bed to a chair (including a wheelchair)?: A Little Help needed standing up from a chair using your arms (e.g., wheelchair or bedside chair)?: A Little Help needed to walk in hospital room?: A Little Help needed climbing 3-5 steps with a railing? : A Little 6 Click Score: 18    End of Session Equipment Utilized During Treatment: Gait belt Activity Tolerance: Patient tolerated treatment well Patient left: in chair;with call bell/phone within reach;with  chair alarm set;with nursing/sitter in room Nurse Communication: Mobility status PT Visit Diagnosis: Difficulty in walking, not elsewhere classified (R26.2);Muscle weakness (generalized) (M62.81)    Time: 1761-6073 PT Time Calculation (min) (ACUTE ONLY): 26 min   Charges:   PT Evaluation $PT Eval Low Complexity: 1 Low         Valentino Saxon, PT, DPT Physical Therapist with [facility]  11/15/2018 3:25 PM

## 2018-11-16 ENCOUNTER — Encounter (HOSPITAL_COMMUNITY): Payer: Self-pay | Admitting: Orthopedic Surgery

## 2018-11-16 DIAGNOSIS — M5127 Other intervertebral disc displacement, lumbosacral region: Secondary | ICD-10-CM | POA: Diagnosis not present

## 2018-11-16 MED ORDER — HYDROCODONE-ACETAMINOPHEN 5-325 MG PO TABS: 1.0000 | ORAL_TABLET | ORAL | 0 refills | Status: DC | PRN

## 2018-11-16 MED ORDER — METHOCARBAMOL 500 MG PO TABS: 500.0000 mg | ORAL_TABLET | Freq: Four times a day (QID) | ORAL | 0 refills | Status: DC | PRN

## 2018-11-16 NOTE — Progress Notes (Signed)
Physical Therapy Treatment Patient Details Name: Gerald Maxwell MRN: [id number] DOB: 02/19/1965 Today's Date: 11/16/2018    History of Present Illness Patient is 53 y.o. male s/p L5-S1 decompressive lumbar laminectomy with microdiskectomy and Lt L5, S1 foraminotomy on 11/15/18 with PMH significant for HTN and bil knee arthroscopy.    PT Comments    POD # 1 Pt eager to D/C this morning.  Assisted with amb in hallway.  General Gait Details: at first amb with RW as he did yesterday then trial without which pt preferred.  Pt present with safe alternating gait with no AD.  Minimal c/o pain. Practiced stairs.  Reviewed bacl precautions and activity  level.  Addressed all mobility questions, discussed appropriate activity, educated on use of ICE.  Pt ready for D/C to home.   Follow Up Recommendations  Follow surgeon's recommendation for DC plan and follow-up therapies     Equipment Recommendations  (pt declines need for RW)    Recommendations for Other Services       Precautions / Restrictions Precautions Precautions: Fall;Back Precaution Booklet Issued: Yes (comment) Precaution Comments: handout and booklet issued and reviewed Restrictions Weight Bearing Restrictions: No    Mobility  Bed Mobility Overal bed mobility: Modified Independent Bed Mobility: Rolling;Sidelying to Sit;Sit to Sidelying Rolling: Modified independent (Device/Increase time) Sidelying to sit: Modified independent (Device/Increase time)     Sit to sidelying: Modified independent (Device/Increase time) General bed mobility comments: pt properly demonstarted "log roll" tech both in and OOB as well as reviewed proper positioning supine/sidelying with pillows  Transfers Overall transfer level: Modified independent Equipment used: None Transfers: Sit to/from Stand Sit to Stand: Modified independent (Device/Increase time)         General transfer comment: <25% VC's on safety with turns and proper hand  placement to steady self during transition  Ambulation/Gait Ambulation/Gait assistance: Modified independent (Device/Increase time);Supervision Gait Distance (Feet): 175 Feet Assistive device: Rolling walker (2 wheeled);None Gait Pattern/deviations: WFL(Within Functional Limits) Gait velocity: WNL's   General Gait Details: at first amb with RW as he did yesterday then trial without which pt preferred.  Pt present with safe alternating gait with no AD.  Minimal c/o pain.   Stairs Stairs: Yes Stairs assistance: Supervision Stair Management: One rail Left;Alternating pattern Number of Stairs: 3 General stair comments: one VC safety with using rail, otherwise performed very well.   Wheelchair Mobility    Modified Rankin (Stroke Patients Only)       Balance                                            Cognition Arousal/Alertness: Awake/alert Behavior During Therapy: WFL for tasks assessed/performed Overall Cognitive Status: Within Functional Limits for tasks assessed                                 General Comments: eager to D/C      Exercises      General Comments        Pertinent Vitals/Pain Pain Assessment: 0-10 Pain Score: 2  Pain Location: lower back and L sciatic Pain Descriptors / Indicators: Aching;Sore;Operative site guarding Pain Intervention(s): Monitored during session;Repositioned;Ice applied    Home Living                      Prior  Function            PT Goals (current goals can now be found in the care plan section) Progress towards PT goals: Progressing toward goals    Frequency    Min 5X/week      PT Plan Current plan remains appropriate    Co-evaluation              AM-PAC PT "6 Clicks" Mobility   Outcome Measure  Help needed turning from your back to your side while in a flat bed without using bedrails?: None Help needed moving from lying on your back to sitting on the side of a  flat bed without using bedrails?: None Help needed moving to and from a bed to a chair (including a wheelchair)?: None Help needed standing up from a chair using your arms (e.g., wheelchair or bedside chair)?: None Help needed to walk in hospital room?: None Help needed climbing 3-5 steps with a railing? : None 6 Click Score: 24    End of Session Equipment Utilized During Treatment: Gait belt Activity Tolerance: Patient tolerated treatment well Patient left: in chair;with call bell/phone within reach;with chair alarm set;with nursing/sitter in room Nurse Communication: Mobility status(pt ready for D/C) PT Visit Diagnosis: Difficulty in walking, not elsewhere classified (R26.2);Muscle weakness (generalized) (M62.81)     Time: 6948-5462 PT Time Calculation (min) (ACUTE ONLY): 29 min  Charges:  $Gait Training: 8-22 mins $Self Care/Home Management: [place]  PTA Acute  Rehabilitation Services Pager      [phone] Office      [phone]

## 2018-11-16 NOTE — Progress Notes (Signed)
   Subjective: 1 Day Post-Op Procedure(s) (LRB): Central decompression lumbar laminectomy spinal stenosis L5-S1, microdiscectomy L5-S1 left, foramonotomy L5-S1 nerve root left (N/A) Patient reports pain as mild.   Patient seen in rounds for Dr. Gladstone Lighter Patient is well, and has had no acute complaints or problems other than discomfort around the operative site. Has a little numbness in the toes but no pain in the legs or buttocks. No SOB or chest pain. Voiding well. Positive flatus.  Plan is to go Home after hospital stay.  Objective: Vital signs in last 24 hours: Temp:  [97.6 F (36.4 C)-98.3 F (36.8 C)] 97.7 F (36.5 C) (11/12 0539) Pulse Rate:  [47-83] 52 (11/12 0539) Resp:  [12-21] 16 (11/12 0539) BP: (102-145)/(64-97) 102/72 (11/12 0539) SpO2:  [93 %-100 %] 97 % (11/12 0539) Weight:  [72.4 kg] 72.4 kg (11/11 1149)  Intake/Output from previous day:  Intake/Output Summary (Last 24 hours) at 11/16/2018 0757 Last data filed at 11/16/2018 0539 Gross per 24 hour  Intake 3131.28 ml  Output 2845 ml  Net 286.28 ml     EXAM General - Patient is Alert and Oriented Extremity - Neurologically intact Intact pulses distally Dorsiflexion/Plantar flexion intact No cellulitis present Compartment soft Dressing/Incision - clean, dry, no drainage Motor Function - intact, moving foot and toes well on exam.   Past Medical History:  Diagnosis Date  . Gout   . High cholesterol   . Hypertension   . Stomach ulcer     Assessment/Plan: 1 Day Post-Op Procedure(s) (LRB): Central decompression lumbar laminectomy spinal stenosis L5-S1, microdiscectomy L5-S1 left, foramonotomy L5-S1 nerve root left (N/A) Active Problems:   Spinal stenosis, lumbar region with neurogenic claudication  Estimated body mass index is 23.57 kg/m as calculated from the following:   Height as of this encounter: 5\' 9"  (1.753 m).   Weight as of this encounter: 72.4 kg. Advance diet Up with therapy D/C IV fluids  when tolerating POs well  DVT Prophylaxis - Aspirin Weight-Bearing as tolerated  Continue with PT this morning. DC home today. Follow up in 2 weeks. Discharge instructions given.   Ardeen Jourdain, PA-C Orthopaedic Surgery 11/16/2018, 7:57 AM

## 2018-11-17 NOTE — Discharge Summary (Signed)
Physician Discharge Summary   Patient ID: Gerald Maxwell MRN: [id number] DOB/AGE: Nov 22, 1965 53 y.o.  Admit date: 11/15/2018 Discharge date: 11/17/2018  Primary Diagnosis:   Lumbar spinal stenosis, herniated nucleus polposus L5-S1 left  Admission Diagnoses:  Past Medical History:  Diagnosis Date   Gout    High cholesterol    Hypertension    Stomach ulcer    Discharge Diagnoses:   Active Problems:   Spinal stenosis, lumbar region with neurogenic claudication  Procedure:  Procedure(s) (LRB): Central decompression lumbar laminectomy spinal stenosis L5-S1, microdiscectomy L5-S1 left, foramonotomy L5-S1 nerve root left (N/A)   Consults: None  HPI: The patient is a 53 year old male who presented to the office with the chief complaint of low back pain with radiation of pain into the left LE. He did not improve with conservative treatments including NSAIDs, corticosteroids, and chiropractor visits. CT myelogram showed lumbar spinal stenosis and disc herniation L5-S1 with degenerative changes at L4-L5 as well.      Laboratory Data: Hospital Outpatient Visit on 11/11/2018  Component Date Value Ref Range Status   SARS-CoV-2, NAA 11/11/2018 NOT DETECTED  NOT DETECTED Final   Comment: (NOTE) This nucleic acid amplification test was developed and its performance characteristics determined by [employer]. Nucleic acid amplification tests include PCR and TMA. This test has not been FDA cleared or approved. This test has been authorized by FDA under an Emergency Use Authorization (EUA). This test is only authorized for the duration of time the declaration that circumstances exist justifying the authorization of the emergency use of in vitro diagnostic tests for detection of SARS-CoV-2 virus and/or diagnosis of COVID-19 infection under section 564(b)(1) of the Act, 21 U.S.C. 211HER-7(E) (1), unless the authorization is terminated or revoked sooner. When diagnostic  testing is negative, the possibility of a false negative result should be considered in the context of a patient's recent exposures and the presence of clinical signs and symptoms consistent with COVID-19. An individual without symptoms of COVID- 19 and who is not shedding SARS-CoV-2 vi                          rus would expect to have a negative (not detected) result in this assay. Performed At: [facility] [address] [place], [place] [id number] Jolene Schimke MD UD:[id number]    Coronavirus Source 11/11/2018 NASOPHARYNGEAL   Final   Performed at [facility], [address], [place], [place] [address]   X-Rays:Dg Lumbar Spine 2-3 Views  Addendum Date: 11/15/2018   ADDENDUM REPORT: 11/15/2018 07:58 ADDENDUM: AP and lateral views of the lumbar spine are number for surgery. Same numbering system used as described in report of lumbar myelography. Electronically Signed   By: Ulyses Southward M.D.   On: 11/15/2018 07:58   Result Date: 11/15/2018 CLINICAL DATA:  Lumbar spine pain EXAM: LUMBAR SPINE - 2-3 VIEW COMPARISON:  CT lumbar spine 10/05/2018 FINDINGS: Five non-rib-bearing lumbar vertebra. Osseous demineralization. Scattered endplate spur formation. Disc space narrowing greatest at L4-L5 with associated grade 1 spondylolisthesis. No fracture, additional subluxation or bone destruction. No definite spondylolysis. SI joints preserved. Extensive atherosclerotic calcifications of aorta and iliac arteries. IMPRESSION: Multilevel degenerative disc and facet disease changes of the lumbar spine with chronic grade 1 spondylolisthesis of L4-L5, unchanged. No acute abnormalities. Electronically Signed: By: Ulyses Southward M.D. On: 11/10/2018 17:04   Dg Spine Portable 1 View  Result Date: 11/15/2018 CLINICAL DATA:  L5-S1 lumbar laminectomy EXAM:  PORTABLE SPINE - 1 VIEW COMPARISON:  Portable cross-table lateral view 0923 hours compared to 11/10/2018 FINDINGS: Curved metallic probe via dorsal  approach projects dorsal to the inferior endplate of L5. Grade 1 anterolisthesis L4-L5. Partial lumbarization of S1. IMPRESSION: Curved metallic probe projects dorsal to the inferior endplate of L5. Electronically Signed   By: Lavonia Dana M.D.   On: 11/15/2018 09:39   Dg Spine Portable 1 View  Result Date: 11/15/2018 CLINICAL DATA:  L5-S1 lumbar laminectomy. EXAM: PORTABLE SPINE - 1 VIEW COMPARISON:  Radiographs of same day and November 10, 2018. FINDINGS: Single intraoperative cross-table lateral projection was obtained of the lumbar spine. This image demonstrates surgical probe directed toward posterior spinous process of L5. IMPRESSION: Surgical localization as described above. Electronically Signed   By: Marijo Conception M.D.   On: 11/15/2018 09:20   Dg Spine Portable 1 View  Result Date: 11/15/2018 CLINICAL DATA:  L5-S1 lumbar laminectomy. EXAM: PORTABLE SPINE - 1 VIEW COMPARISON:  11/10/2018. FINDINGS: Using the same numbering scheme as on 11/10/2018 the lumbar spine levels have been annotated. There are 2 surgical probes identified posterior to S1 and S2. Anterolisthesis of L4 on L5 is again noted. Marked L5-S1 degenerative disc disease. IMPRESSION: 1. Surgical probes are identified posterior to S1 and S2. 2. Stable anterolisthesis of L4 on L5. Electronically Signed   By: Kerby Moors M.D.   On: 11/15/2018 09:10    EKG: Orders placed or performed during the hospital encounter of 11/10/18   EKG   EKG     Hospital Course: Patient was admitted to [facility] and taken to the OR and underwent the above state procedure without complications.  Patient tolerated the procedure well and was later transferred to the recovery room and then to the orthopaedic floor for postoperative care.  They were given PO and IV analgesics for pain control following their surgery.  They were given 24 hours of postoperative antibiotics.   PT was consulted postop to assist with mobility and transfers.  The  patient was allowed to be WBAT with therapy and was taught back precautions. Discharge planning was consulted to help with postop disposition and equipment needs.  Patient had a good night on the evening of surgery and started to get up OOB with therapy on day one. Patient was seen in rounds and was ready to go home on day one.  They were given discharge instructions and dressing directions.  They were instructed on when to follow up in the office with Dr.Gioffre.   Diet: Cardiac diet Activity:WBAT Follow-up:in 2 weeks Disposition - Home Discharged Condition: stable   Discharge Instructions    Call MD / Call 911   Complete by: As directed    If you experience chest pain or shortness of breath, CALL 911 and be transported to the hospital emergency room.  If you develope a fever above 101 F, pus (white drainage) or increased drainage or redness at the wound, or calf pain, call your surgeon's office.   Constipation Prevention   Complete by: As directed    Drink plenty of fluids.  Prune juice may be helpful.  You may use a stool softener, such as Colace (over the counter) 100 mg twice a day.  Use MiraLax (over the counter) for constipation as needed.   Diet - low sodium heart healthy   Complete by: As directed    Discharge instructions   Complete by: As directed    For the first three days, remove  your dressing, and tape a piece of saran wrap over your incision Take your shower, then remove the saran wrap and put a clean dressing on.. After three days you can shower without the saran wrap.  No lifting or excessive bending No driving while taking pain medications.  Call Dr. Darrelyn HillockGioffre if any wound complications or temperature of 101 degrees F or over.  Call the office for an appointment to see Dr. Darrelyn HillockGioffre in two weeks: [phone] and ask for Dr. Jeannetta EllisGioffre's nurse, Mackey Birchwoodammy Johnson.   Incentive spirometry RT   Complete by: As directed    Increase activity slowly as tolerated   Complete by: As  directed      Allergies as of 11/16/2018   No Known Allergies     Medication List    TAKE these medications   allopurinol 300 MG tablet Commonly known as: ZYLOPRIM Take 300 mg by mouth daily.   chlorthalidone 25 MG tablet Commonly known as: HYGROTON Take 25 mg by mouth daily.   famotidine 20 MG tablet Commonly known as: PEPCID Take 20 mg by mouth 2 (two) times daily.   gabapentin 300 MG capsule Commonly known as: NEURONTIN Take 300 mg by mouth 2 (two) times daily.   HYDROcodone-acetaminophen 5-325 MG tablet Commonly known as: NORCO/VICODIN Take 1 tablet by mouth every 4 (four) hours as needed for moderate pain.   methocarbamol 500 MG tablet Commonly known as: ROBAXIN Take 1 tablet (500 mg total) by mouth every 6 (six) hours as needed for muscle spasms.   multivitamin with minerals Tabs tablet Take 1 tablet by mouth daily. Men's One A Day   predniSONE 10 MG tablet Commonly known as: DELTASONE Take 10 mg by mouth daily with breakfast.   quinapril 40 MG tablet Commonly known as: ACCUPRIL Take 40 mg by mouth daily.   verapamil 240 MG CR tablet Commonly known as: CALAN-SR Take 240 mg by mouth daily.   vitamin B-12 1000 MCG tablet Commonly known as: CYANOCOBALAMIN Take 1,000 mcg by mouth daily.      Follow-up Information    Ranee GosselinGioffre, Ronald, MD. Schedule an appointment as soon as possible for a visit in 2 week(s).   Specialty: Orthopedic Surgery Contact information: [address] [place] [id number] [phone]           Signed: Dimitri PedAmber Phyllistine Domingos, PA-C Orthopaedic Surgery 11/17/2018, 9:07 AM

## 2018-12-19 DIAGNOSIS — M545 Low back pain, unspecified: Secondary | ICD-10-CM | POA: Insufficient documentation

## 2019-02-15 DIAGNOSIS — L821 Other seborrheic keratosis: Secondary | ICD-10-CM | POA: Insufficient documentation

## 2020-10-06 DIAGNOSIS — K4090 Unilateral inguinal hernia, without obstruction or gangrene, not specified as recurrent: Secondary | ICD-10-CM | POA: Insufficient documentation

## 2022-02-16 DIAGNOSIS — F17210 Nicotine dependence, cigarettes, uncomplicated: Secondary | ICD-10-CM | POA: Insufficient documentation

## 2022-02-16 DIAGNOSIS — I499 Cardiac arrhythmia, unspecified: Secondary | ICD-10-CM | POA: Insufficient documentation

## 2022-02-16 DIAGNOSIS — I10 Essential (primary) hypertension: Secondary | ICD-10-CM | POA: Insufficient documentation

## 2022-03-30 ENCOUNTER — Other Ambulatory Visit: Payer: Self-pay | Admitting: Urology

## 2022-03-30 DIAGNOSIS — R972 Elevated prostate specific antigen [PSA]: Secondary | ICD-10-CM

## 2022-05-03 ENCOUNTER — Ambulatory Visit
Admission: RE | Admit: 2022-05-03 | Discharge: 2022-05-03 | Disposition: A | Discharge status: Home / Self Care | Payer: 59 | Source: Ambulatory Visit | Attending: Urology | Admitting: Urology

## 2022-05-03 DIAGNOSIS — R972 Elevated prostate specific antigen [PSA]: Secondary | ICD-10-CM

## 2022-05-03 MED ORDER — GADOPICLENOL 0.5 MMOL/ML IV SOLN
7.5000 mL | Freq: Once | INTRAVENOUS | Status: AC | PRN
  Administered 2022-05-03: 7.5 mL via INTRAVENOUS

## 2022-06-17 ENCOUNTER — Telehealth: Payer: Self-pay | Admitting: Radiation Oncology

## 2022-06-17 NOTE — Telephone Encounter (Signed)
Left message for patient to call back to schedule consult per 6/12 referral.

## 2022-06-21 ENCOUNTER — Telehealth: Payer: Self-pay | Admitting: Radiation Oncology

## 2022-06-21 NOTE — Telephone Encounter (Signed)
Left message for patient to call back to schedule consult per 6/12 referral. 

## 2022-06-22 ENCOUNTER — Telehealth: Payer: Self-pay | Admitting: Radiation Oncology

## 2022-06-22 NOTE — Telephone Encounter (Signed)
Left message for patient to call back to schedule consult per 6/12 referral. 

## 2022-06-29 ENCOUNTER — Telehealth: Payer: Self-pay | Admitting: Radiation Oncology

## 2022-06-29 NOTE — Telephone Encounter (Signed)
Left message for patient to call back to schedule consult per 6/12 referral. 

## 2022-07-05 ENCOUNTER — Telehealth: Payer: Self-pay | Admitting: Radiation Oncology

## 2022-07-05 NOTE — Telephone Encounter (Signed)
Pt contacted [place] stating he was working out of town where he did not have good cell service. I was able to schedule pt next available consult with Dr. Kathrynn Running.

## 2022-07-09 NOTE — Progress Notes (Signed)
GU Location of Tumor / Histology:  Prostate Cancer  If Prostate Cancer, Gleason Score is (3 + 4) and PSA is (5.2 on 02/19/2022)  Biopsies      05/03/2022 Dr. Cristal Deer Winter MR Prostate with/without Contrast CLINICAL DATA: Elevated PSA level of 5.2 on 02/16/2022. R97.20   IMPRESSION: 1. PI-RADS category 3 lesion of the bilateral posteromedial peripheral zone in the midline at the apex. Targeting data sent to [facility]. 2. 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 This could be assessed with dedicated hip imaging if clinically warranted. 3. Atheromatous plaque in the right external iliac and common femoral artery.  Past/Anticipated interventions by urology, if any:     Past/Anticipated interventions by medical oncology, if any: NA  Weight changes, if any: No  IPSS:  16 SHIM:  16  Bowel/Bladder complaints, if any:  No  Nausea/Vomiting, if any: No  Pain issues, if any:  0/10  SAFETY ISSUES: Prior radiation? No Pacemaker/ICD? No Possible current pregnancy? Male Is the patient on methotrexate? No  Current Complaints / other details:  Patient is leaning towards having surgery but is willing to hear about radiation treatment.

## 2022-07-12 ENCOUNTER — Ambulatory Visit
Admission: RE | Admit: 2022-07-12 | Discharge: 2022-07-12 | Disposition: A | Discharge status: Home / Self Care | Payer: 59 | Source: Ambulatory Visit | Attending: Radiation Oncology | Admitting: Radiation Oncology

## 2022-07-12 ENCOUNTER — Encounter: Payer: Self-pay | Admitting: Radiation Oncology

## 2022-07-12 ENCOUNTER — Telehealth: Payer: Self-pay | Admitting: Radiation Oncology

## 2022-07-12 VITALS — BP 144/83 | HR 97 | Temp 97.5°F | Resp 18 | Ht 69.0 in | Wt 165.4 lb

## 2022-07-12 DIAGNOSIS — C61 Malignant neoplasm of prostate: Secondary | ICD-10-CM | POA: Diagnosis present

## 2022-07-12 DIAGNOSIS — N529 Male erectile dysfunction, unspecified: Secondary | ICD-10-CM | POA: Insufficient documentation

## 2022-07-12 DIAGNOSIS — Z860100 Personal history of colon polyps, unspecified: Secondary | ICD-10-CM | POA: Insufficient documentation

## 2022-07-12 DIAGNOSIS — Z800 Family history of malignant neoplasm of digestive organs: Secondary | ICD-10-CM | POA: Insufficient documentation

## 2022-07-12 DIAGNOSIS — M109 Gout, unspecified: Secondary | ICD-10-CM | POA: Insufficient documentation

## 2022-07-12 DIAGNOSIS — Z1211 Encounter for screening for malignant neoplasm of colon: Secondary | ICD-10-CM | POA: Insufficient documentation

## 2022-07-12 HISTORY — DX: Malignant neoplasm of prostate: C61

## 2022-07-12 NOTE — Telephone Encounter (Signed)
Unable to LVM to offer pt be seen sooner per Dr. Kathrynn Running.

## 2022-07-12 NOTE — Progress Notes (Signed)
Introduced myself to the patient, and his wife, as the prostate nurse navigator.  No barriers to care identified at this time.  He is here to discuss his radiation treatment options.  He does have a surgical consult with Dr. Laverle Patter on 7/9, and is agreeable for me to follow up with him after.  I gave him my business card and asked him to call me with questions or concerns.  Verbalized understanding.

## 2022-07-12 NOTE — Progress Notes (Signed)
Radiation Oncology         [phone] ________________________________  Initial Outpatient Consultation  Name: Gerald Maxwell MRN: [id number]  Date: 07/12/2022  DOB: [birth year]  WN:IOEVOJJ, No Pcp Per  Shelly Rubenstein*   REFERRING PHYSICIAN: Shelly Rubenstein*  DIAGNOSIS: 57 y.o. 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    ICD-10-CM   1. Malignant neoplasm of prostate (HCC)  C61       HISTORY OF PRESENT ILLNESS: Gerald Maxwell is a 57 y.o. male with a diagnosis of prostate cancer. He was noted to have an elevated PSA of 5.2 by his primary care physician, Ladora Daniel, PA.  Accordingly, he was referred for evaluation in urology by Dr. Liliane Shi on 03/29/22. He underwent prostate MRI on 05/03/22 showing: PI-RADS 3 lesion of bilateral posteromedial peripheral zone in midline at apex. The patient proceeded to MRI fusion biopsy of the prostate on 06/03/22.  The prostate volume measured 26 cc.  Out of 15 core biopsies, 6 were positive.  The maximum Gleason score was 3+4, and this was seen in all three samples from ROI (with perineural invasion), left apex, and right apex latera. Additionally, a small focus of Gleason 3+3 was seen in right apex (with PNI).  The patient reviewed the biopsy results with his urologist and he has kindly been referred today for discussion of potential radiation treatment options.   PREVIOUS RADIATION THERAPY: No  PAST MEDICAL HISTORY:  Past Medical History:  Diagnosis Date   Gout    High cholesterol    Hypertension    Prostate cancer (HCC)    Stomach ulcer       PAST SURGICAL HISTORY: Past Surgical History:  Procedure Laterality Date   APPENDECTOMY     KNEE ARTHROSCOPY Bilateral    LUMBAR LAMINECTOMY/DECOMPRESSION MICRODISCECTOMY N/A 11/15/2018   Procedure: Central decompression lumbar laminectomy spinal stenosis L5-S1, microdiscectomy L5-S1 left, foramonotomy L5-S1 nerve root left;   Surgeon: Ranee Gosselin, MD;  Location: [facility];  Service: Orthopedics;  Laterality: N/A;    UMBILICAL HERNIA REPAIR      FAMILY HISTORY: No family history on file.  SOCIAL HISTORY:  Social History   Socioeconomic History   Marital status: Married    Spouse name: Not on file   Number of children: Not on file   Years of education: Not on file   Highest education level: Not on file  Occupational History   Not on file  Tobacco Use   Smoking status: Every Day    Packs/day: .25    Types: Cigarettes   Smokeless tobacco: Never  Vaping Use   Vaping Use: Never used  Substance and Sexual Activity   Alcohol use: Yes    Alcohol/week: 12.0 standard drinks of alcohol    Types: 12 Shots of liquor per week    Comment: week   Drug use: Never   Sexual activity: Yes  Other Topics Concern   Not on file  Social History Narrative   Not on file   Social Determinants of Health   Financial Resource Strain: Not on file  Food Insecurity: No Food Insecurity (07/12/2022)   Hunger Vital Sign    Worried About Running Out of Food in the Last Year: Never true    Ran Out of Food in the Last Year: Never true  Transportation Needs: No Transportation Needs (07/12/2022)   PRAPARE - Administrator, Civil Service (Medical): No  Lack of Transportation (Non-Medical): No  Physical Activity: Not on file  Stress: Not on file  Social Connections: Not on file  Intimate Partner Violence: Not At Risk (07/12/2022)   Humiliation, Afraid, Rape, and Kick questionnaire    Fear of Current or Ex-Partner: No    Emotionally Abused: No    Physically Abused: No    Sexually Abused: No    ALLERGIES: Patient has no known allergies.  MEDICATIONS:  Current Outpatient Medications  Medication Sig Dispense Refill   tadalafil (CIALIS) 20 MG tablet Take 1 tablet as needed for E.D.     allopurinol (ZYLOPRIM) 300 MG tablet Take 300 mg by mouth daily.     chlorthalidone (HYGROTON) 25 MG tablet Take 25 mg by  mouth daily.     lisinopril (ZESTRIL) 40 MG tablet Take 40 mg by mouth daily.     Multiple Vitamin (MULTIVITAMIN WITH MINERALS) TABS tablet Take 1 tablet by mouth daily. Men's One A Day     verapamil (CALAN-SR) 240 MG CR tablet Take 240 mg by mouth daily.      vitamin B-12 (CYANOCOBALAMIN) 1000 MCG tablet Take 1,000 mcg by mouth daily.     No current facility-administered medications for this encounter.    REVIEW OF SYSTEMS:  On review of systems, the patient reports that he is doing well overall. He denies any chest pain, shortness of breath, cough, fevers, chills, night sweats, unintended weight changes. He denies any bowel disturbances, and denies abdominal pain, nausea or vomiting. He denies any new musculoskeletal or joint aches or pains. His IPSS was 16, indicating moderate urinary symptoms. His SHIM was 16, indicating he mild-moderate erectile dysfunction. A complete review of systems is obtained and is otherwise negative.    PHYSICAL EXAM:  Wt Readings from Last 3 Encounters:  07/12/22 165 lb 6.4 oz (75 kg)  11/15/18 159 lb 9.8 oz (72.4 kg)  11/10/18 159 lb 9 oz (72.4 kg)   Temp Readings from Last 3 Encounters:  07/12/22 (!) 97.5 F (36.4 C)  11/16/18 97.7 F (36.5 C) (Oral)  11/10/18 98.6 F (37 C) (Oral)   BP Readings from Last 3 Encounters:  07/12/22 (!) 144/83  11/16/18 101/70  11/10/18 130/73   Pulse Readings from Last 3 Encounters:  07/12/22 97  11/16/18 (!) 55  11/10/18 89   Pain Assessment Pain Score: 0-No pain/10  In general this is a well appearing  male in no acute distress. He's alert and oriented x4 and appropriate throughout the examination. Cardiopulmonary assessment is negative for acute distress, and he exhibits normal effort.     KPS = 100  100 - Normal; no complaints; no evidence of disease. 90   - Able to carry on normal activity; minor signs or symptoms of disease. 80   - Normal activity with effort; some signs or symptoms of disease. 51   -  Cares for self; unable to carry on normal activity or to do active work. 60   - Requires occasional assistance, but is able to care for most of his personal needs. 50   - Requires considerable assistance and frequent medical care. 40   - Disabled; requires special care and assistance. 30   - Severely disabled; hospital admission is indicated although death not imminent. 20   - Very sick; hospital admission necessary; active supportive treatment necessary. 10   - Moribund; fatal processes progressing rapidly. 0     - Dead  Karnofsky DA, Abelmann WH, Craver LS and Burchenal [facility] [phone])  The use of the nitrogen mustards in the palliative treatment of carcinoma: with particular reference to bronchogenic carcinoma Cancer 1 634-56  LABORATORY DATA:  Lab Results  Component Value Date   WBC 7.3 11/10/2018   HGB 15.1 11/10/2018   HCT 44.5 11/10/2018   MCV 108.0 (H) 11/10/2018   PLT 167 11/10/2018   Lab Results  Component Value Date   NA 137 11/10/2018   K 4.0 11/10/2018   CL 101 11/10/2018   CO2 26 11/10/2018   Lab Results  Component Value Date   ALT 27 11/10/2018   AST 36 11/10/2018   ALKPHOS 56 11/10/2018   BILITOT 0.9 11/10/2018     RADIOGRAPHY: No results found.    IMPRESSION/PLAN: 1. 57 y.o. gentleman with Stage T1 adenocarcinoma of the prostate with Gleason Score of 3+4, and PSA of 5.2. We discussed the patient's workup and outlined the nature of prostate cancer in this setting. The patient's T stage, Gleason's score, and PSA put him into the favorable intermediate risk group. Accordingly, he is eligible for a variety of potential treatment options including brachytherapy, 5.5 weeks of external radiation, or prostatectomy. We discussed the available radiation techniques, and focused on the details and logistics of delivery. We discussed and outlined the risks, benefits, short and long-term effects associated with radiotherapy and compared and contrasted these with prostatectomy. We  discussed the role of SpaceOAR gel in reducing the rectal toxicity associated with radiotherapy. He appears to have a good understanding of his disease and our treatment recommendations which are of curative intent.  He was encouraged to ask questions that were answered to his stated satisfaction.  The patient is scheduled to meet with Dr. Laverle Patter tomorrow for a surgery consult. At the conclusion of our conversation, he was leaning towards choosing surgery but would like to learn about all of his options prior to making a final decision. He received the contact information our nurse navigator, Marisue Ivan, to use once he has made his treatment decision.   We personally spent 60 minutes in this encounter including chart review, reviewing radiological studies, meeting face-to-face with the patient, entering orders and completing documentation.     Joyice Faster, PA-C    Margaretmary Dys, MD  [facility]  Radiation Oncology Direct Dial: [phone]  Fax: [phone] [email or web address]  Skype  [email or web address]   This document serves as a record of services personally performed by Margaretmary Dys, MD and Joyice Faster, PA-C. It was created on their behalf by Mickie Bail, a trained medical scribe. The creation of this record is based on the scribe's personal observations and the provider's statements to them. This document has been checked and approved by the attending provider.

## 2022-07-20 NOTE — Progress Notes (Signed)
RN left message for call back to follow up with treatment decision/questions.

## 2022-07-23 NOTE — Progress Notes (Signed)
RN returned call.  Voicemail left for call back.  

## 2022-07-27 NOTE — Progress Notes (Signed)
RN spoke with patient and patient has confirmed he would like to proceed with robotic prostatectomy with Dr. Laverle Patter.    RN will notified MD, patient request surgery in October is possible.   Plan of care in progress.

## 2022-08-18 ENCOUNTER — Other Ambulatory Visit: Payer: Self-pay | Admitting: Urology

## 2022-08-19 ENCOUNTER — Other Ambulatory Visit: Payer: Self-pay | Admitting: Urology

## 2022-10-11 NOTE — Progress Notes (Signed)
Anesthesia Review:  PCP: Ladora Daniel LOV 02/16/22  Cardiologist : Chest x-ray : EKG : Echo : Stress test: Cardiac Cath :  Activity level:  Sleep Study/ CPAP : Fasting Blood Sugar :      / Checks Blood Sugar -- times a day:   Blood Thinner/ Instructions /Last Dose: ASA / Instructions/ Last Dose :    Hx of MESA

## 2022-10-11 NOTE — Patient Instructions (Signed)
SURGICAL WAITING ROOM VISITATION  Patients having surgery or a procedure may have no more than 2 support people in the waiting area - these visitors may rotate.    Children under the age of 45 must have an adult with them who is not the patient.  Due to an increase in RSV and influenza rates and associated hospitalizations, children ages 39 and under may not visit patients in [facility] hospitals.  If the patient needs to stay at the hospital during part of their recovery, the visitor guidelines for inpatient rooms apply. Pre-op nurse will coordinate an appropriate time for 1 support person to accompany patient in pre-op.  This support person may not rotate.    Please refer to the [facility] website for the visitor guidelines for Inpatients (after your surgery is over and you are in a regular room).       Your procedure is scheduled on:  10/21/2022    Report to [facility] Main Entrance    Report to admitting at   0915 AM   Call this number if you have problems the morning of surgery [phone]               Clear liquid diet the day  before surgery.                Magnesium Citrate 8 ounces at 12 noon day before surgery.               Fleets enema nite before surgery              Nothing after midnite.                          Water         Apple juice, white grape juice, white cranberry juice         Coffee, tea- with no milk cream or creamer, sugar and sweetener ok           Popsicles, plain jello, - no red           Gatorade ( sport drinks ) - no red                               If you have questions, please contact your surgeon's office.   FOLLOW BOWEL PREP AND ANY ADDITIONAL PRE OP INSTRUCTIONS YOU RECEIVED FROM YOUR SURGEON'S OFFICE!!!     Oral Hygiene is also important to reduce your risk of infection.                                    Remember - BRUSH YOUR TEETH THE MORNING OF SURGERY WITH YOUR REGULAR TOOTHPASTE  DENTURES WILL BE REMOVED PRIOR  TO SURGERY PLEASE DO NOT APPLY "Poly grip" OR ADHESIVES!!!   Do NOT smoke after Midnight   Stop all vitamins and herbal supplements 7 days before surgery.   Take these medicines the morning of surgery with A SIP OF WATER:  allopurinol, verapamil   DO NOT TAKE ANY ORAL DIABETIC MEDICATIONS DAY OF YOUR SURGERY  Bring CPAP mask and tubing day of surgery.                              You may not have any  metal on your body including hair pins, jewelry, and body piercing             Do not wear make-up, lotions, powders, perfumes/cologne, or deodorant  Do not wear nail polish including gel and S&S, artificial/acrylic nails, or any other type of covering on natural nails including finger and toenails. If you have artificial nails, gel coating, etc. that needs to be removed by a nail salon please have this removed prior to surgery or surgery may need to be canceled/ delayed if the surgeon/ anesthesia feels like they are unable to be safely monitored.   Do not shave  48 hours prior to surgery.               Men may shave face and neck.   Do not bring valuables to the hospital. [facility] IS NOT             RESPONSIBLE   FOR VALUABLES.   Contacts, glasses, dentures or bridgework may not be worn into surgery.   Bring small overnight bag day of surgery.   DO NOT BRING YOUR HOME MEDICATIONS TO THE HOSPITAL. PHARMACY WILL DISPENSE MEDICATIONS LISTED ON YOUR MEDICATION LIST TO YOU DURING YOUR ADMISSION IN THE HOSPITAL!    Patients discharged on the day of surgery will not be allowed to drive home.  Someone NEEDS to stay with you for the first 24 hours after anesthesia.   Special Instructions: Bring a copy of your healthcare power of attorney and living will documents the day of surgery if you haven't scanned them before.              Please read over the following fact sheets you were given: IF YOU HAVE QUESTIONS ABOUT YOUR PRE-OP INSTRUCTIONS PLEASE CALL [phone]   If you received a  COVID test during your pre-op visit  it is requested that you wear a mask when out in public, stay away from anyone that may not be feeling well and notify your surgeon if you develop symptoms. If you test positive for Covid or have been in contact with anyone that has tested positive in the last 10 days please notify you surgeon.    [facility] - Preparing for Surgery Before surgery, you can play an important role.  Because skin is not sterile, your skin needs to be as free of germs as possible.  You can reduce the number of germs on your skin by washing with CHG (chlorahexidine gluconate) soap before surgery.  CHG is an antiseptic cleaner which kills germs and bonds with the skin to continue killing germs even after washing. Please DO NOT use if you have an allergy to CHG or antibacterial soaps.  If your skin becomes reddened/irritated stop using the CHG and inform your nurse when you arrive at Short Stay. Do not shave (including legs and underarms) for at least 48 hours prior to the first CHG shower.  You may shave your face/neck. Please follow these instructions carefully:  1.  Shower with CHG Soap the night before surgery and the  morning of Surgery.  2.  If you choose to wash your hair, wash your hair first as usual with your  normal  shampoo.  3.  After you shampoo, rinse your hair and body thoroughly to remove the  shampoo.                           4.  Use CHG as  you would any other liquid soap.  You can apply chg directly  to the skin and wash                       Gently with a scrungie or clean washcloth.  5.  Apply the CHG Soap to your body ONLY FROM THE NECK DOWN.   Do not use on face/ open                           Wound or open sores. Avoid contact with eyes, ears mouth and genitals (private parts).                       Wash face,  Genitals (private parts) with your normal soap.             6.  Wash thoroughly, paying special attention to the area where your surgery  will be  performed.  7.  Thoroughly rinse your body with warm water from the neck down.  8.  DO NOT shower/wash with your normal soap after using and rinsing off  the CHG Soap.                9.  Pat yourself dry with a clean towel.            10.  Wear clean pajamas.            11.  Place clean sheets on your bed the night of your first shower and do not  sleep with pets. Day of Surgery : Do not apply any lotions/deodorants the morning of surgery.  Please wear clean clothes to the hospital/surgery center.  FAILURE TO FOLLOW THESE INSTRUCTIONS MAY RESULT IN THE CANCELLATION OF YOUR SURGERY PATIENT SIGNATURE_________________________________  NURSE SIGNATURE__________________________________  ________________________________________________________________________

## 2022-10-13 ENCOUNTER — Encounter (HOSPITAL_COMMUNITY)
Admission: RE | Admit: 2022-10-13 | Discharge: 2022-10-13 | Disposition: A | Discharge status: Home / Self Care | Payer: Managed Care, Other (non HMO) | Source: Ambulatory Visit | Attending: Urology | Admitting: Urology

## 2022-10-13 ENCOUNTER — Encounter (HOSPITAL_COMMUNITY): Payer: Self-pay

## 2022-10-13 ENCOUNTER — Other Ambulatory Visit: Payer: Self-pay

## 2022-10-13 VITALS — BP 147/90 | HR 87 | Temp 98.2°F | Resp 16 | Ht 68.0 in | Wt 155.0 lb

## 2022-10-13 DIAGNOSIS — Z01818 Encounter for other preprocedural examination: Secondary | ICD-10-CM | POA: Insufficient documentation

## 2022-10-13 DIAGNOSIS — F17200 Nicotine dependence, unspecified, uncomplicated: Secondary | ICD-10-CM | POA: Diagnosis not present

## 2022-10-13 DIAGNOSIS — I10 Essential (primary) hypertension: Secondary | ICD-10-CM | POA: Insufficient documentation

## 2022-10-13 DIAGNOSIS — Z8546 Personal history of malignant neoplasm of prostate: Secondary | ICD-10-CM | POA: Insufficient documentation

## 2022-10-13 DIAGNOSIS — M109 Gout, unspecified: Secondary | ICD-10-CM | POA: Diagnosis not present

## 2022-10-13 HISTORY — DX: Unspecified osteoarthritis, unspecified site: M19.90

## 2022-10-13 LAB — BASIC METABOLIC PANEL
Anion gap: 13 (ref 5–15)
BUN: 16 mg/dL (ref 6–20)
CO2: 25 mmol/L (ref 22–32)
Calcium: 9.9 mg/dL (ref 8.9–10.3)
Chloride: 92 mmol/L — ABNORMAL LOW (ref 98–111)
Creatinine, Ser: 0.95 mg/dL (ref 0.61–1.24)
GFR, Estimated: >60 mL/min (ref >60)
Glucose, Bld: 114 mg/dL — ABNORMAL HIGH (ref 70–99)
Potassium: 4 mmol/L (ref 3.5–5.1)
Sodium: 130 mmol/L — ABNORMAL LOW (ref 135–145)

## 2022-10-13 LAB — CBC
HCT: 38.9 % — ABNORMAL LOW (ref 39.0–52.0)
Hemoglobin: 14.1 g/dL (ref 13.0–17.0)
MCH: 37.2 pg — ABNORMAL HIGH (ref 26.0–34.0)
MCHC: 36.2 g/dL — ABNORMAL HIGH (ref 30.0–36.0)
MCV: 102.6 fL — ABNORMAL HIGH (ref 80.0–100.0)
Platelets: 166 10*3/uL (ref 150–400)
RBC: 3.79 MIL/uL — ABNORMAL LOW (ref 4.22–5.81)
RDW: 13.4 % (ref 11.5–15.5)
WBC: 10.2 10*3/uL (ref 4.0–10.5)
nRBC: 0 % (ref 0.0–0.2)

## 2022-10-13 LAB — SURGICAL PCR SCREEN
MRSA, PCR: NEGATIVE
Staphylococcus aureus: NEGATIVE

## 2022-10-14 NOTE — Anesthesia Preprocedure Evaluation (Addendum)
Anesthesia Evaluation  Patient identified by MRN, date of birth, ID band Patient awake    Reviewed: Allergy & Precautions, H&P , NPO status , Patient's Chart, lab work & pertinent test results  Airway Mallampati: II  TM Distance: >3 FB Neck ROM: Full    Dental no notable dental hx.    Pulmonary Current Smoker   Pulmonary exam normal breath sounds clear to auscultation       Cardiovascular hypertension, Normal cardiovascular exam Rhythm:Regular Rate:Normal     Neuro/Psych negative neurological ROS  negative psych ROS   GI/Hepatic Neg liver ROS, PUD,,,  Endo/Other  negative endocrine ROS    Renal/GU negative Renal ROS  negative genitourinary   Musculoskeletal  (+) Arthritis ,    Abdominal   Peds negative pediatric ROS (+)  Hematology negative hematology ROS (+)   Anesthesia Other Findings   Reproductive/Obstetrics negative OB ROS                              Anesthesia Physical Anesthesia Plan  ASA: 3  Anesthesia Plan: General   Post-op Pain Management:    Induction: Intravenous  PONV Risk Score and Plan: Ondansetron, Dexamethasone and Treatment may vary due to age or medical condition  Airway Management Planned: Oral ETT  Additional Equipment:   Intra-op Plan:   Post-operative Plan: Extubation in OR  Informed Consent: I have reviewed the patients History and Physical, chart, labs and discussed the procedure including the risks, benefits and alternatives for the proposed anesthesia with the patient or authorized representative who has indicated his/her understanding and acceptance.     Dental advisory given  Plan Discussed with: CRNA  Anesthesia Plan Comments: (See PAT note from 10/9 by K Gekas PA-C   Huntington Kray is a 57 yo male who presents to PAT prior to prostatectomy on 10/21/2022 with Dr. Laverle Patter. PMH of current smoking, daily ETOH use (no hx of withdrawals),  HTN, gout, perforated gastric ulcer s/p gram patch repair in the setting of heavy NSAID use, arthritis, and prostate cancer.   Patient follows with PCP for chronic medical conditions. Last seen on 02/16/2022. All medical issues stable. )         Anesthesia Quick Evaluation

## 2022-10-14 NOTE — Progress Notes (Signed)
  DISCUSSION: Gerald Maxwell is a 57 yo male who presents to PAT prior to prostatectomy on 10/21/2022 with Dr. Laverle Patter. PMH of current smoking, daily ETOH use (no hx of withdrawals), HTN, gout, perforated gastric ulcer s/p gram patch repair in the setting of heavy NSAID use, arthritis, and prostate cancer.  Patient follows with PCP for chronic medical conditions. Last seen on 02/16/2022. All medical issues stable.   VS: BP (!) 147/90   Pulse 87   Temp 36.8 C (Oral)   Resp 16   Ht 5\' 8"  (1.727 m)   Wt 70.3 kg   SpO2 100%   BMI 23.57 kg/m   PROVIDERS: Ladora Daniel, PA-C   LABS: Labs reviewed: Acceptable for surgery. Mild hyponatremia noted at 130, high MCV, possibly from ETOH use (all labs ordered are listed, but only abnormal results are displayed)  Labs Reviewed  CBC - Abnormal; Notable for the following components:      Result Value   RBC 3.79 (*)    HCT 38.9 (*)    MCV 102.6 (*)    MCH 37.2 (*)    MCHC 36.2 (*)    All other components within normal limits  BASIC METABOLIC PANEL - Abnormal; Notable for the following components:   Sodium 130 (*)    Chloride 92 (*)    Glucose, Bld 114 (*)    All other components within normal limits  SURGICAL PCR SCREEN  TYPE AND SCREEN     IMAGES:  CT Chest 03/30/22 ([facility]):  FINDINGS:   Lungs and pleura:  Patent central airways. No pleural effusion or pneumothorax. Mild  emphysematous changes. Small possible infiltrate versus scar in the  posterior inferior right lower lobe.   Mediastinum/Soft Tissues:  Note is made of coronary calcifications. No adenopathy. Aortic and carotid  artery calcifications.   Upper Abdomen:  Apparent right renal atrophy. Hepatic steatosis.   Bones:  No acute or aggressive bony abnormality. Changes of DISH in the lower  thoracic spine.   Nodule assessment:  1.  No suspicious lung nodules.    EKG 10/13/22:  NSR, rate 88   CV:  Past Medical History:  Diagnosis Date   Arthritis    Gout     High cholesterol    Hypertension    Prostate cancer (HCC)    Stomach ulcer     Past Surgical History:  Procedure Laterality Date   APPENDECTOMY     hole in stomach surgery      KNEE ARTHROSCOPY Bilateral    LUMBAR LAMINECTOMY/DECOMPRESSION MICRODISCECTOMY N/A 11/15/2018   Procedure: Central decompression lumbar laminectomy spinal stenosis L5-S1, microdiscectomy L5-S1 left, foramonotomy L5-S1 nerve root left;  Surgeon: Ranee Gosselin, MD;  Location: [facility] ORS;  Service: Orthopedics;  Laterality: N/A;    UMBILICAL HERNIA REPAIR     x 3    MEDICATIONS:  acetaminophen (TYLENOL) 500 MG tablet   allopurinol (ZYLOPRIM) 300 MG tablet   chlorthalidone (HYGROTON) 25 MG tablet   lisinopril (ZESTRIL) 40 MG tablet   Multiple Vitamin (MULTIVITAMIN WITH MINERALS) TABS tablet   tadalafil (CIALIS) 20 MG tablet   verapamil (CALAN-SR) 240 MG CR tablet   vitamin B-12 (CYANOCOBALAMIN) 1000 MCG tablet   No current facility-administered medications for this encounter.   Marcille Blanco [facility] Surgical Short Stay/Anesthesiology [facility] Phone [phone] 10/14/2022 10:22 AM

## 2022-10-19 ENCOUNTER — Encounter: Payer: Self-pay | Admitting: Vascular Surgery

## 2022-10-19 ENCOUNTER — Ambulatory Visit (INDEPENDENT_AMBULATORY_CARE_PROVIDER_SITE_OTHER): Payer: Managed Care, Other (non HMO) | Admitting: Vascular Surgery

## 2022-10-19 VITALS — BP 127/79 | HR 84 | Temp 97.6°F | Resp 18 | Ht 68.0 in | Wt 159.0 lb

## 2022-10-19 DIAGNOSIS — I6529 Occlusion and stenosis of unspecified carotid artery: Secondary | ICD-10-CM | POA: Insufficient documentation

## 2022-10-19 DIAGNOSIS — I6523 Occlusion and stenosis of bilateral carotid arteries: Secondary | ICD-10-CM | POA: Diagnosis not present
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
## 2022-10-20 NOTE — H&P (Signed)
Office Visit Report     10/05/2022   --------------------------------------------------------------------------------   Gerald Maxwell  MRN: [id number]  DOB: 1965/03/05, 57 year old Male  SSN:    PRIMARY CARE:  Gerald Maxwell, [place]  PRIMARY CARE FAX:  [phone]  REFERRING:  Gerald Maxwell  PROVIDER:  Rhoderick Maxwell, M.D.  TREATING:  Gerald Maxwell [place], [place]  LOCATION:  [facility] [phone]     --------------------------------------------------------------------------------   CC/HPI: Pt presents today for pre-operative history and physical exam in anticipation of robotic assisted lap radical prostatectomy (possibly open) with bilateral pelvic lymph node dissection by Dr. Laverle Maxwell on 10/21/22. He is doing well and is without complaint.   Pt has been evaled by Dr. Despina Maxwell regarding his hip. He needs a replacement which he will schedule after he has recovered from his prostatectomy.   Pt denies F/C, HA, CP, SOB, N/V, diarrhea/constipation, back pain, flank pain, hematuria, and dysuria.     HX:   CC: Prostate Cancer   Physician requesting consult: Gerald Maxwell  PCP: Gerald Daniel, PA-C   Gerald Maxwell is a 57 year old gentleman who was found to have an elevated PSA of 5.2 prompting an MRI of the prostate on 04/30/22 that indicated a 6 mm PI-RADS 3 lesion of the bilateral apex. An MR/US fusion biopsy on 06/03/22 confirmed Gleason 3+4=7 adenocarcinoma with 6 out of 15 biopsy cores positive for malignancy including all targeted biopsies. All other positive biopsies were also at the apex consistent with his MRI findings.   Family history: None.   Imaging studies: MRI (04/30/22) - No EPE, SVI, LAD, or bone lesions.   PMH: He has a history of hypertension, gout, arthritis, and hyperlipidemia.  PSH: Open appendectomy (ruptured appendix which required initial drainage followed by the right lower quadrant incision for his appendectomy), hernia x 3 (some of these  have been laparoscopic hernia repairs and others open inguinal), lung surgery for infectious reasons. He also has had a perforated gastric ulcer status post exploratory laparotomy and repair through an upper midline incision in 2020.   TNM stage: cT1c N0 Mx  PSA: 5.2  Gleason score: 3+4=7 (GG 2)  Biopsy (06/03/22): 6/15 cores positive  Left: L apex (10%, 3+4=7)  Right: R apex (5%, 3+3=6, PNI), R lateral apex (5%, 3+4=7)  MR target: 3/3 cores (3+4=7, 30%, 5%, 5%)  Prostate volume: 26 cc   Nomogram  OC disease: 66%  EPE: 33%  SVI: 3%  LNI: 4%  PFS (5 year, 10 year): 83%, 72%   Urinary function: IPSS is 14.  Erectile function: SHIM score is 18. He does use tadalafil 20 mg as needed and does have reliable results when he uses his medication but does not require it all the time.     ALLERGIES: None   MEDICATIONS: Allopurinol 300 mg tablet  Levofloxacin 750 mg tablet 1 tablet PO As Directed Take one hour prior to your scheduled procedure.  Lisinopril 40 mg tablet  Tamsulosin Hcl 0.4 mg capsule 1 capsule PO Daily  Valium 10 mg tablet 1 tablet PO As Directed Take one hour prior to your scheduled procedure.  Chlorthalidone 25 mg tablet  Mens Multivitamin  Verapamil Er 240 mg capsule, extended release pellets 24 hr  Vitamin B12     GU PSH: Prostate Needle Biopsy - 06/03/2022       PSH Notes: Ex lap for perforated gastric ulcer   Bilateral knee scopes    NON-GU PSH: Appendectomy (open),  ruptured Back Surgery (Unspecified) Hernia Repair, x's 3 Lung Surgery (Unspecified) Surgical Pathology, Gross And Microscopic Examination For Prostate Needle - 06/03/2022     GU PMH: Stress Incontinence - 09/23/2022 Prostate Cancer - 09/09/2022, - 07/13/2022, - 06/10/2022 Elevated PSA - 06/03/2022, - 03/29/2022      PMH Notes: stomach ulcer   NON-GU PMH: Muscle weakness (generalized) - 09/23/2022, - 09/09/2022 Other muscle spasm - 09/23/2022 Arthritis Gout Hypercholesterolemia Hypertension     FAMILY HISTORY: 1 Daughter - Daughter   SOCIAL HISTORY: Marital Status: Married Preferred Language: English; Ethnicity: Not Hispanic Or Latino; Race: White Current Smoking Status: Patient smokes.   Tobacco Use Assessment Completed: Used Tobacco in last 30 days? Does not use smokeless tobacco. Does drink.  Does not use drugs. Drinks 1 caffeinated drink per day. Has not had a blood transfusion.     Notes: Smokes <1 ppd x 20 years  ETOH beer or bourbon 3-4 per day    REVIEW OF SYSTEMS:    GU Review Male:   Patient denies frequent urination, hard to postpone urination, burning/ pain with urination, get up at night to urinate, leakage of urine, stream starts and stops, trouble starting your stream, have to strain to urinate , erection problems, and penile pain.  Gastrointestinal (Upper):   Patient denies nausea, vomiting, and indigestion/ heartburn.  Gastrointestinal (Lower):   Patient denies diarrhea and constipation.  Constitutional:   Patient denies fever, night sweats, weight loss, and fatigue.  Skin:   Patient denies skin rash/ lesion and itching.  Eyes:   Patient denies blurred vision and double vision.  Ears/ Nose/ Throat:   Patient denies sore throat and sinus problems.  Hematologic/Lymphatic:   Patient denies swollen glands and easy bruising.  Cardiovascular:   Patient denies leg swelling and chest pains.  Respiratory:   Patient denies cough and shortness of breath.  Endocrine:   Patient denies excessive thirst.  Musculoskeletal:   Patient denies back pain and joint pain.  Neurological:   Patient denies headaches and dizziness.  Psychologic:   Patient denies depression and anxiety.   VITAL SIGNS:      10/05/2022 01:49 PM  BP 147/80 mmHg  Pulse 90 /min  Temperature 97.5 F / 36.3 C   MULTI-SYSTEM PHYSICAL EXAMINATION:    Constitutional: Well-nourished. No physical deformities. Normally developed. Good grooming.  Neck: Neck symmetrical, not swollen. Normal tracheal  position. Bilateral carotid bruits  Respiratory: No labored breathing, no use of accessory muscles. Exp wheeze left base; CTA right   Cardiovascular: Regular rate and rhythm. No murmur, no gallop.   Lymphatic: No enlargement of neck, axillae, groin.  Skin: No paleness, no jaundice, no cyanosis. No lesion, no ulcer, no rash.  Neurologic / Psychiatric: Oriented to time, oriented to place, oriented to person. No depression, no anxiety, no agitation.  Gastrointestinal: No mass, no tenderness, no rigidity, non obese abdomen.  Eyes: Normal conjunctivae. Normal eyelids.  Ears, Nose, Mouth, and Throat: Left ear no scars, no lesions, no masses. Right ear no scars, no lesions, no masses. Nose no scars, no lesions, no masses. Normal hearing. Normal lips.  Musculoskeletal: Normal gait and station of head and neck.     Complexity of Data:  Records Review:   Previous Patient Records  Urine Test Review:   Urinalysis   10/05/22  Urinalysis  Urine Appearance Clear   Urine Color Yellow   Urine Glucose Neg mg/dL  Urine Bilirubin Neg mg/dL  Urine Ketones Neg mg/dL  Urine Specific Gravity <=1.005  Urine Blood Neg ery/uL  Urine pH 7.5   Urine Protein Neg mg/dL  Urine Urobilinogen 0.2 mg/dL  Urine Nitrites Neg   Urine Leukocyte Esterase Neg leu/uL   PROCEDURES:          Urinalysis - 81003 Dipstick Dipstick Cont'd  Color: Yellow Bilirubin: Neg mg/dL  Appearance: Clear Ketones: Neg mg/dL  Specific Gravity: <=1.610 Blood: Neg ery/uL  pH: 7.5 Protein: Neg mg/dL  Glucose: Neg mg/dL Urobilinogen: 0.2 mg/dL    Nitrites: Neg    Leukocyte Esterase: Neg leu/uL    ASSESSMENT:      ICD-10 Details  1 GU:   Prostate Cancer - C61    PLAN:           Schedule Return Visit/Planned Activity: Keep Scheduled Appointment - Schedule Surgery          Document Letter(s):  Created for Patient: Clinical Summary         Notes:   There are no changes in the patients history or physical exam since last  evaluation by Dr. Laverle Maxwell. Pt is scheduled to undergo RALP (poss open) with BPLND on 10/21/22.   Pt is seeing his PCP Gerald Daniel PA-C tomorrow for routine f/u. Advised him to ask Lavonna Rua to order a carotid duplex to eval bruits/stenosis as he is a long time smoker with HTN and HLD. He denies any TIA/CVA sx.   All pt's questions were answered to the best of my ability.          Next Appointment:      Next Appointment: 10/21/2022 11:15 AM    Appointment Type: Surgery     Location: [facility] [phone]    Provider: Heloise Purpura, M.D.    Reason for Visit: WL/OBS RA LAP RAD PROSTATECTOMY LEV 3 AND BPLND WITH AMANDA      * Signed by Ulyses Amor, PA on 10/05/22 at 2:31 PM (EDT*  Carotid ultrasound performed earlier this week and vascular surgery evaluation by Dr. Chestine Spore with no significant stenosis.

## 2022-10-21 ENCOUNTER — Ambulatory Visit (HOSPITAL_COMMUNITY): Payer: Managed Care, Other (non HMO) | Admitting: Medical

## 2022-10-21 ENCOUNTER — Other Ambulatory Visit: Payer: Self-pay

## 2022-10-21 ENCOUNTER — Encounter (HOSPITAL_COMMUNITY)
Admission: RE | Disposition: A | Discharge status: Home / Self Care | Payer: Self-pay | Source: Home / Self Care | Attending: Urology

## 2022-10-21 ENCOUNTER — Observation Stay (HOSPITAL_COMMUNITY)
Admission: RE | Admit: 2022-10-21 | Discharge: 2022-10-22 | Disposition: A | Discharge status: Home / Self Care | Payer: Managed Care, Other (non HMO) | Attending: Urology | Admitting: Urology

## 2022-10-21 ENCOUNTER — Encounter (HOSPITAL_COMMUNITY): Payer: Self-pay | Admitting: Urology

## 2022-10-21 ENCOUNTER — Ambulatory Visit (HOSPITAL_COMMUNITY): Payer: Managed Care, Other (non HMO)

## 2022-10-21 DIAGNOSIS — C61 Malignant neoplasm of prostate: Principal | ICD-10-CM | POA: Diagnosis present

## 2022-10-21 DIAGNOSIS — Z79899 Other long term (current) drug therapy: Secondary | ICD-10-CM | POA: Insufficient documentation

## 2022-10-21 DIAGNOSIS — F17200 Nicotine dependence, unspecified, uncomplicated: Secondary | ICD-10-CM | POA: Diagnosis not present

## 2022-10-21 DIAGNOSIS — I10 Essential (primary) hypertension: Secondary | ICD-10-CM | POA: Diagnosis not present

## 2022-10-21 DIAGNOSIS — Z01818 Encounter for other preprocedural examination: Secondary | ICD-10-CM

## 2022-10-21 HISTORY — PX: ROBOT ASSISTED LAPAROSCOPIC RADICAL PROSTATECTOMY: SHX5141

## 2022-10-21 HISTORY — PX: LYMPHADENECTOMY: SHX5960

## 2022-10-21 LAB — HEMOGLOBIN AND HEMATOCRIT, BLOOD
HCT: 36.4 % — ABNORMAL LOW (ref 39.0–52.0)
Hemoglobin: 13.1 g/dL (ref 13.0–17.0)

## 2022-10-21 LAB — TYPE AND SCREEN
ABO/RH(D): O POS
Antibody Screen: NEGATIVE

## 2022-10-21 SURGERY — XI ROBOTIC ASSISTED LAPAROSCOPIC RADICAL PROSTATECTOMY LEVEL 3
Anesthesia: General

## 2022-10-21 MED ORDER — ORAL CARE MOUTH RINSE: 15.0000 mL | Freq: Once | OROMUCOSAL | Status: AC

## 2022-10-21 MED ORDER — BUPIVACAINE-EPINEPHRINE 0.25% -1:200000 IJ SOLN
INTRAMUSCULAR | Status: DC | PRN
  Administered 2022-10-21: 27 mL

## 2022-10-21 MED ORDER — ACETAMINOPHEN 10 MG/ML IV SOLN
INTRAVENOUS | Status: DC | PRN
Start: 2022-10-21 — End: 2022-10-21
  Administered 2022-10-21: 1000 mg via INTRAVENOUS

## 2022-10-21 MED ORDER — OXYCODONE HCL 5 MG/5ML PO SOLN: 5.0000 mg | Freq: Once | ORAL | Status: AC | PRN

## 2022-10-21 MED ORDER — CHLORTHALIDONE 25 MG PO TABS
25.0000 mg | ORAL_TABLET | Freq: Every day | ORAL | Status: DC
  Administered 2022-10-21 – 2022-10-22 (×2): 25 mg via ORAL
  Filled 2022-10-21 (×2): qty 1

## 2022-10-21 MED ORDER — LIDOCAINE HCL (CARDIAC) PF 100 MG/5ML IV SOSY
PREFILLED_SYRINGE | INTRAVENOUS | Status: DC | PRN
  Administered 2022-10-21: 60 mg via INTRAVENOUS

## 2022-10-21 MED ORDER — CEFAZOLIN SODIUM-DEXTROSE 2-4 GM/100ML-% IV SOLN
2.0000 g | INTRAVENOUS | Status: AC
  Administered 2022-10-21: 2 g via INTRAVENOUS
  Filled 2022-10-21: qty 100

## 2022-10-21 MED ORDER — CHLORHEXIDINE GLUCONATE 0.12 % MT SOLN
15.0000 mL | Freq: Once | OROMUCOSAL | Status: AC
  Administered 2022-10-21: 15 mL via OROMUCOSAL

## 2022-10-21 MED ORDER — MORPHINE SULFATE (PF) 2 MG/ML IV SOLN: 2.0000 mg | INTRAVENOUS | Status: DC | PRN

## 2022-10-21 MED ORDER — MIDAZOLAM HCL 2 MG/2ML IJ SOLN
INTRAMUSCULAR | Status: AC
  Filled 2022-10-21: qty 2

## 2022-10-21 MED ORDER — SODIUM CHLORIDE 0.9 % IV BOLUS
1000.0000 mL | Freq: Once | INTRAVENOUS | Status: AC
  Administered 2022-10-21: 1000 mL via INTRAVENOUS

## 2022-10-21 MED ORDER — LACTATED RINGERS IV SOLN: INTRAVENOUS | Status: DC

## 2022-10-21 MED ORDER — ALLOPURINOL 300 MG PO TABS
300.0000 mg | ORAL_TABLET | Freq: Every day | ORAL | Status: DC
  Administered 2022-10-22: 300 mg via ORAL
  Filled 2022-10-21: qty 1

## 2022-10-21 MED ORDER — ONDANSETRON HCL 4 MG/2ML IJ SOLN: 4.0000 mg | INTRAMUSCULAR | Status: DC | PRN

## 2022-10-21 MED ORDER — HYOSCYAMINE SULFATE 0.125 MG SL SUBL: 0.1250 mg | SUBLINGUAL_TABLET | Freq: Four times a day (QID) | SUBLINGUAL | Status: DC | PRN

## 2022-10-21 MED ORDER — CEFAZOLIN SODIUM-DEXTROSE 1-4 GM/50ML-% IV SOLN
1.0000 g | Freq: Three times a day (TID) | INTRAVENOUS | Status: AC
  Administered 2022-10-21 – 2022-10-22 (×2): 1 g via INTRAVENOUS
  Filled 2022-10-21 (×2): qty 50

## 2022-10-21 MED ORDER — FLEET ENEMA RE ENEM: 1.0000 | ENEMA | Freq: Once | RECTAL | Status: DC

## 2022-10-21 MED ORDER — ACETAMINOPHEN 10 MG/ML IV SOLN: 1000.0000 mg | Freq: Once | INTRAVENOUS | Status: DC | PRN

## 2022-10-21 MED ORDER — KCL IN DEXTROSE-NACL 20-5-0.45 MEQ/L-%-% IV SOLN
INTRAVENOUS | Status: AC
  Filled 2022-10-21: qty 1000

## 2022-10-21 MED ORDER — ROCURONIUM BROMIDE 100 MG/10ML IV SOLN
INTRAVENOUS | Status: DC | PRN
  Administered 2022-10-21: 70 mg via INTRAVENOUS
  Administered 2022-10-21: 30 mg via INTRAVENOUS
  Administered 2022-10-21 (×2): 20 mg via INTRAVENOUS

## 2022-10-21 MED ORDER — LISINOPRIL 20 MG PO TABS
40.0000 mg | ORAL_TABLET | Freq: Every day | ORAL | Status: DC
  Administered 2022-10-21 – 2022-10-22 (×2): 40 mg via ORAL
  Filled 2022-10-21 (×2): qty 2

## 2022-10-21 MED ORDER — DOCUSATE SODIUM 100 MG PO CAPS: 100.0000 mg | ORAL_CAPSULE | Freq: Two times a day (BID) | ORAL | Status: DC

## 2022-10-21 MED ORDER — FENTANYL CITRATE PF 50 MCG/ML IJ SOSY
25.0000 ug | PREFILLED_SYRINGE | INTRAMUSCULAR | Status: DC | PRN
  Administered 2022-10-21: 50 ug via INTRAVENOUS

## 2022-10-21 MED ORDER — VERAPAMIL HCL ER 240 MG PO TBCR
240.0000 mg | EXTENDED_RELEASE_TABLET | Freq: Every day | ORAL | Status: DC
  Administered 2022-10-22: 240 mg via ORAL
  Filled 2022-10-21: qty 1

## 2022-10-21 MED ORDER — FENTANYL CITRATE (PF) 100 MCG/2ML IJ SOLN
INTRAMUSCULAR | Status: AC
  Filled 2022-10-21: qty 2

## 2022-10-21 MED ORDER — PHENYLEPHRINE HCL-NACL 20-0.9 MG/250ML-% IV SOLN
INTRAVENOUS | Status: DC | PRN
Start: 2022-10-21 — End: 2022-10-21
  Administered 2022-10-21: 40 ug/min via INTRAVENOUS

## 2022-10-21 MED ORDER — ZOLPIDEM TARTRATE 5 MG PO TABS: 5.0000 mg | ORAL_TABLET | Freq: Every evening | ORAL | Status: DC | PRN

## 2022-10-21 MED ORDER — DROPERIDOL 2.5 MG/ML IJ SOLN: 0.6250 mg | Freq: Once | INTRAMUSCULAR | Status: DC | PRN

## 2022-10-21 MED ORDER — DEXAMETHASONE SODIUM PHOSPHATE 4 MG/ML IJ SOLN
INTRAMUSCULAR | Status: DC | PRN
Start: 2022-10-21 — End: 2022-10-21
  Administered 2022-10-21: 8 mg via INTRAVENOUS

## 2022-10-21 MED ORDER — FENTANYL CITRATE PF 50 MCG/ML IJ SOSY
PREFILLED_SYRINGE | INTRAMUSCULAR | Status: AC
  Filled 2022-10-21: qty 1

## 2022-10-21 MED ORDER — ACETAMINOPHEN 325 MG PO TABS
650.0000 mg | ORAL_TABLET | ORAL | Status: DC | PRN
  Administered 2022-10-22: 650 mg via ORAL
  Filled 2022-10-21: qty 2

## 2022-10-21 MED ORDER — TRIPLE ANTIBIOTIC 3.5-400-5000 EX OINT: 1.0000 | TOPICAL_OINTMENT | Freq: Three times a day (TID) | CUTANEOUS | Status: DC | PRN

## 2022-10-21 MED ORDER — MAGNESIUM CITRATE PO SOLN: 1.0000 | Freq: Once | ORAL | Status: DC

## 2022-10-21 MED ORDER — FENTANYL CITRATE (PF) 100 MCG/2ML IJ SOLN
INTRAMUSCULAR | Status: DC | PRN
  Administered 2022-10-21: 25 ug via INTRAVENOUS
  Administered 2022-10-21: 100 ug via INTRAVENOUS
  Administered 2022-10-21: 25 ug via INTRAVENOUS
  Administered 2022-10-21: 50 ug via INTRAVENOUS

## 2022-10-21 MED ORDER — BUPIVACAINE-EPINEPHRINE 0.25% -1:200000 IJ SOLN
INTRAMUSCULAR | Status: AC
  Filled 2022-10-21: qty 1

## 2022-10-21 MED ORDER — KETAMINE HCL 10 MG/ML IJ SOLN
INTRAMUSCULAR | Status: DC | PRN
Start: 2022-10-21 — End: 2022-10-21
  Administered 2022-10-21: 20 mg via INTRAVENOUS

## 2022-10-21 MED ORDER — SUGAMMADEX SODIUM 200 MG/2ML IV SOLN
INTRAVENOUS | Status: DC | PRN
Start: 2022-10-21 — End: 2022-10-21
  Administered 2022-10-21: 200 mg via INTRAVENOUS

## 2022-10-21 MED ORDER — OXYCODONE HCL 5 MG PO TABS
5.0000 mg | ORAL_TABLET | Freq: Once | ORAL | Status: AC | PRN
  Administered 2022-10-21: 5 mg via ORAL

## 2022-10-21 MED ORDER — DIPHENHYDRAMINE HCL 50 MG/ML IJ SOLN: 12.5000 mg | Freq: Four times a day (QID) | INTRAMUSCULAR | Status: DC | PRN

## 2022-10-21 MED ORDER — ONDANSETRON HCL 4 MG/2ML IJ SOLN
INTRAMUSCULAR | Status: DC | PRN
  Administered 2022-10-21: 4 mg via INTRAVENOUS

## 2022-10-21 MED ORDER — DIPHENHYDRAMINE HCL 12.5 MG/5ML PO ELIX: 12.5000 mg | ORAL_SOLUTION | Freq: Four times a day (QID) | ORAL | Status: DC | PRN

## 2022-10-21 MED ORDER — DOCUSATE SODIUM 100 MG PO CAPS
100.0000 mg | ORAL_CAPSULE | Freq: Two times a day (BID) | ORAL | Status: DC
  Administered 2022-10-21 – 2022-10-22 (×2): 100 mg via ORAL
  Filled 2022-10-21 (×3): qty 1

## 2022-10-21 MED ORDER — SULFAMETHOXAZOLE-TRIMETHOPRIM 800-160 MG PO TABS: 1.0000 | ORAL_TABLET | Freq: Two times a day (BID) | ORAL | 0 refills | Status: DC

## 2022-10-21 MED ORDER — OXYCODONE HCL 5 MG PO TABS
ORAL_TABLET | ORAL | Status: AC
  Filled 2022-10-21: qty 1

## 2022-10-21 MED ORDER — STERILE WATER FOR IRRIGATION IR SOLN
Status: DC | PRN
  Administered 2022-10-21: 1000 mL

## 2022-10-21 MED ORDER — ORAL CARE MOUTH RINSE: 15.0000 mL | OROMUCOSAL | Status: DC | PRN

## 2022-10-21 MED ORDER — KETOROLAC TROMETHAMINE 15 MG/ML IJ SOLN
15.0000 mg | Freq: Four times a day (QID) | INTRAMUSCULAR | Status: DC
  Administered 2022-10-21 – 2022-10-22 (×4): 15 mg via INTRAVENOUS
  Filled 2022-10-21 (×4): qty 1

## 2022-10-21 MED ORDER — PROPOFOL 10 MG/ML IV BOLUS
INTRAVENOUS | Status: DC | PRN
  Administered 2022-10-21 (×3): 50 mg via INTRAVENOUS
  Administered 2022-10-21: 150 mg via INTRAVENOUS

## 2022-10-21 MED ORDER — MIDAZOLAM HCL 5 MG/5ML IJ SOLN
INTRAMUSCULAR | Status: DC | PRN
  Administered 2022-10-21: 2 mg via INTRAVENOUS

## 2022-10-21 MED ORDER — KETAMINE HCL 50 MG/5ML IJ SOSY
PREFILLED_SYRINGE | INTRAMUSCULAR | Status: AC
  Filled 2022-10-21: qty 5

## 2022-10-21 MED ORDER — PROPOFOL 10 MG/ML IV BOLUS
INTRAVENOUS | Status: AC
  Filled 2022-10-21: qty 20

## 2022-10-21 MED ORDER — TRAMADOL HCL 50 MG PO TABS
50.0000 mg | ORAL_TABLET | Freq: Four times a day (QID) | ORAL | 0 refills | Status: DC | PRN
Start: 2022-10-21 — End: 2022-12-23

## 2022-10-21 SURGICAL SUPPLY — 67 items
ADH SKN CLS APL DERMABOND .7 (GAUZE/BANDAGES/DRESSINGS) ×2
APL PRP STRL LF DISP 70% ISPRP (MISCELLANEOUS) ×2
APL SWBSTK 6 STRL LF DISP (MISCELLANEOUS) ×2
APPLICATOR COTTON TIP 6 STRL (MISCELLANEOUS) ×2 IMPLANT
APPLICATOR COTTON TIP 6IN STRL (MISCELLANEOUS) ×2
BAG COUNTER SPONGE SURGICOUNT (BAG) IMPLANT
BAG SPNG CNTER NS LX DISP (BAG)
CATH FOLEY 2WAY SLVR 18FR 30CC (CATHETERS) ×2 IMPLANT
CATH ROBINSON RED A/P 16FR (CATHETERS) ×2 IMPLANT
CATH ROBINSON RED A/P 8FR (CATHETERS) ×2 IMPLANT
CATH TIEMANN FOLEY 18FR 5CC (CATHETERS) ×2 IMPLANT
CHLORAPREP W/TINT 26 (MISCELLANEOUS) ×2 IMPLANT
CLIP LIGATING HEM O LOK PURPLE (MISCELLANEOUS) ×2 IMPLANT
COVER SURGICAL LIGHT HANDLE (MISCELLANEOUS) ×2 IMPLANT
COVER TIP SHEARS 8 DVNC (MISCELLANEOUS) ×2 IMPLANT
CUTTER ECHEON FLEX ENDO 45 340 (ENDOMECHANICALS) ×2 IMPLANT
DERMABOND ADVANCED .7 DNX12 (GAUZE/BANDAGES/DRESSINGS) ×2 IMPLANT
DRAIN CHANNEL RND F F (WOUND CARE) IMPLANT
DRAPE ARM DVNC X/XI (DISPOSABLE) ×8 IMPLANT
DRAPE COLUMN DVNC XI (DISPOSABLE) ×2 IMPLANT
DRAPE SURG IRRIG POUCH 19X23 (DRAPES) ×2 IMPLANT
DRIVER NDL LRG 8 DVNC XI (INSTRUMENTS) ×4 IMPLANT
DRIVER NDLE LRG 8 DVNC XI (INSTRUMENTS) ×4
DRSG TEGADERM 4X4.75 (GAUZE/BANDAGES/DRESSINGS) ×2 IMPLANT
ELECT PENCIL ROCKER SW 15FT (MISCELLANEOUS) ×2 IMPLANT
ELECT REM PT RETURN 15FT ADLT (MISCELLANEOUS) ×2 IMPLANT
FORCEPS BPLR LNG DVNC XI (INSTRUMENTS) ×2 IMPLANT
FORCEPS PROGRASP DVNC XI (FORCEP) ×2 IMPLANT
GAUZE SPONGE 4X4 12PLY STRL (GAUZE/BANDAGES/DRESSINGS) ×2 IMPLANT
GLOVE BIO SURGEON STRL SZ 6.5 (GLOVE) ×2 IMPLANT
GLOVE SURG LX STRL 7.5 STRW (GLOVE) ×4 IMPLANT
GOWN STRL REUS W/ TWL XL LVL3 (GOWN DISPOSABLE) ×4 IMPLANT
GOWN STRL REUS W/TWL XL LVL3 (GOWN DISPOSABLE) ×4
GOWN STRL SURGICAL XL XLNG (GOWN DISPOSABLE) ×2 IMPLANT
HOLDER FOLEY CATH W/STRAP (MISCELLANEOUS) ×2 IMPLANT
IRRIG SUCT STRYKERFLOW 2 WTIP (MISCELLANEOUS) ×2
IRRIGATION SUCT STRKRFLW 2 WTP (MISCELLANEOUS) ×2 IMPLANT
IV LACTATED RINGERS 1000ML (IV SOLUTION) ×2 IMPLANT
KIT TURNOVER KIT A (KITS) IMPLANT
NDL SAFETY ECLIPSE 18X1.5 (NEEDLE) IMPLANT
PACK ROBOT UROLOGY CUSTOM (CUSTOM PROCEDURE TRAY) ×2 IMPLANT
PLUG CATH AND CAP STRL 200 (CATHETERS) ×2 IMPLANT
RELOAD STAPLE 45 4.1 GRN THCK (STAPLE) ×2 IMPLANT
SCISSORS MNPLR CVD DVNC XI (INSTRUMENTS) ×2 IMPLANT
SEAL UNIV 5-12 XI (MISCELLANEOUS) ×8 IMPLANT
SET CYSTO W/LG BORE CLAMP LF (SET/KITS/TRAYS/PACK) IMPLANT
SET TUBE SMOKE EVAC HIGH FLOW (TUBING) ×2 IMPLANT
SOL ELECTROSURG ANTI STICK (MISCELLANEOUS) ×2
SOL PREP POV-IOD 4OZ 10% (MISCELLANEOUS) ×2 IMPLANT
SOLUTION ELECTROSURG ANTI STCK (MISCELLANEOUS) ×2 IMPLANT
SPIKE FLUID TRANSFER (MISCELLANEOUS) ×2 IMPLANT
STAPLE RELOAD 45 GRN (STAPLE) ×2
SUT ETHILON 3 0 PS 1 (SUTURE) ×2 IMPLANT
SUT MNCRL 3 0 RB1 (SUTURE) ×2 IMPLANT
SUT MNCRL 3 0 VIOLET RB1 (SUTURE) ×2 IMPLANT
SUT MNCRL AB 4-0 PS2 18 (SUTURE) ×4 IMPLANT
SUT PDS PLUS AB 0 CT-2 (SUTURE) ×4 IMPLANT
SUT VIC AB 0 CT1 27 (SUTURE) ×4
SUT VIC AB 0 CT1 27XBRD ANTBC (SUTURE) ×4 IMPLANT
SUT VIC AB 2-0 SH 27 (SUTURE) ×4
SUT VIC AB 2-0 SH 27X BRD (SUTURE) ×2 IMPLANT
SUT VIC AB 3-0 SH 27 (SUTURE)
SUT VIC AB 3-0 SH 27X BRD (SUTURE) IMPLANT
SYR 27GX1/2 1ML LL SAFETY (SYRINGE) ×2 IMPLANT
TOWEL OR NON WOVEN STRL DISP B (DISPOSABLE) ×2 IMPLANT
TROCAR Z THREAD OPTICAL 12X100 (TROCAR) IMPLANT
WATER STERILE IRR 1000ML POUR (IV SOLUTION) ×2 IMPLANT

## 2022-10-21 NOTE — Interval H&P Note (Signed)
History and Physical Interval Note:  10/21/2022 10:13 AM  Gerald Maxwell  has presented today for surgery, with the diagnosis of PROSTATE CANCER.  The various methods of treatment have been discussed with the patient and family. After consideration of risks, benefits and other options for treatment, the patient has consented to  Procedure(s): XI ROBOTIC ASSISTED LAPAROSCOPIC RADICAL PROSTATECTOMY LEVEL 3 (N/A) BILATERAL PELVIC LYMPHADENECTOMY (Bilateral) as a surgical intervention.  The patient's history has been reviewed, patient examined, no change in status, stable for surgery.  I have reviewed the patient's chart and labs.  Questions were answered to the patient's satisfaction.     [employer]

## 2022-10-21 NOTE — Discharge Instructions (Signed)
Activity:  You are encouraged to ambulate frequently (about every hour during waking hours) to help prevent blood clots from forming in your legs or lungs.  However, you should not engage in any heavy lifting (> 10-15 lbs), strenuous activity, or straining. Diet: You should continue a clear liquid diet until passing gas from below.  Once this occurs, you may advance your diet to a soft diet that would be easy to digest (i.e soups, scrambled eggs, mashed potatoes, etc.) for 24 hours just as you would if getting over a bad stomach flu.  If tolerating this diet well for 24 hours, you may then begin eating regular food.  It will be normal to have some amount of bloating, nausea, and abdominal discomfort intermittently. Prescriptions:  You will be provided a prescription for pain medication to take as needed.  If your pain is not severe enough to require the prescription pain medication, you may take Tylenol instead.  You should also take an over the counter stool softener (Colace 100 mg twice daily) to avoid straining with bowel movements as the pain medication may constipate you. Finally, you will also be provided a prescription for an antibiotic to begin the day prior to your return visit in the office for catheter removal. Catheter care: You will be taught how to take care of the catheter by the nursing staff prior to discharge from the hospital.  You may use both a leg bag and the larger bedside bag but it is recommended to at least use the bigger bedside bag at nighttime as the leg bag is small and will fill up overnight and also does not drain as well when lying flat. You may periodically feel a strong urge to void with the catheter in place.  This is a bladder spasm and most often can occur when having a bowel movement or when you are moving around. It is typically self-limited and usually will stop after a few minutes.  You may use some Vaseline or Neosporin around the tip of the catheter to reduce friction  at the tip of the penis. Incisions: You may remove your dressing bandages the 2nd day after surgery.  You most likely will have a few small staples in each of the incisions and once the bandages are removed, the incisions may stay open to air.  You may start showering (not soaking or bathing in water) 48 hours after surgery and the incisions simply need to be patted dry after the shower.  No additional care is needed. What to call us about: You should call the office [phone]) if you develop fever > 101, persistent vomiting, or the catheter stops draining. Also, feel free to call with any other questions you may have and remember the handout that was provided to you as a reference preoperatively which answers many of the common questions that arise after surgery. You may resume aspirin, advil, aleve, vitamins, and supplements 7 days after surgery.

## 2022-10-21 NOTE — Transfer of Care (Signed)
Immediate Anesthesia Transfer of Care Note  Patient: Gerald Maxwell  Procedure(s) Performed: XI ROBOTIC ASSISTED LAPAROSCOPIC RADICAL PROSTATECTOMY LEVEL 3 BILATERAL PELVIC LYMPHADENECTOMY (Bilateral)  Patient Location: PACU  Anesthesia Type:General  Level of Consciousness: awake and alert   Airway & Oxygen Therapy: Patient Spontanous Breathing  Post-op Assessment: Report given to RN  Post vital signs: Reviewed and stable  Last Vitals:  Vitals Value Taken Time  BP 115/66 10/21/22 1351  Temp    Pulse 75 10/21/22 1354  Resp 18 10/21/22 1354  SpO2 90 % 10/21/22 1354  Vitals shown include unfiled device data.  Last Pain:  Vitals:   10/21/22 0921  TempSrc: Oral         Complications: No notable events documented.

## 2022-10-21 NOTE — Anesthesia Postprocedure Evaluation (Signed)
Anesthesia Post Note  Patient: Gerald Maxwell  Procedure(s) Performed: XI ROBOTIC ASSISTED LAPAROSCOPIC RADICAL PROSTATECTOMY LEVEL 3 BILATERAL PELVIC LYMPHADENECTOMY (Bilateral)     Patient location during evaluation: PACU Anesthesia Type: General Level of consciousness: awake and alert Pain management: pain level controlled Vital Signs Assessment: post-procedure vital signs reviewed and stable Respiratory status: spontaneous breathing, nonlabored ventilation, respiratory function stable and patient connected to nasal cannula oxygen Cardiovascular status: blood pressure returned to baseline and stable Postop Assessment: no apparent nausea or vomiting Anesthetic complications: no   No notable events documented.  Last Vitals:  Vitals:   10/21/22 1500 10/21/22 1524  BP: 135/73 (!) 141/73  Pulse: 67 65  Resp: 17 20  Temp: (!) 36.4 C 36.9 C  SpO2: 95% 100%    Last Pain:  Vitals:   10/21/22 1524  TempSrc: Oral  PainSc:                  [place]

## 2022-10-21 NOTE — Op Note (Signed)
Preoperative diagnosis: Clinically localized adenocarcinoma of the prostate (clinical stage T1c N0 Mx)  Postoperative diagnosis: Clinically localized adenocarcinoma of the prostate (clinical stage T1c N0 Mx)  Procedure:  Robotic assisted laparoscopic radical prostatectomy (bilateral nerve sparing) Bilateral robotic assisted laparoscopic pelvic lymphadenectomy  Surgeon: Moody Bruins. M.D.  Assistant: Harrie Foreman, PA-C  An assistant was required for this surgical procedure.  The duties of the assistant included but were not limited to suctioning, passing suture, camera manipulation, retraction. This procedure would not be able to be performed without an Geophysicist/field seismologist.  Anesthesia: General  Complications: None  EBL: 100 mL  IVF:  1000 mL crystalloid  Specimens: Prostate and seminal vesicles Right pelvic lymph nodes Left pelvic lymph nodes  Disposition of specimens: Pathology  Drains: 20 Fr coude catheter # 19 Blake pelvic drain  Indication: Gerald Maxwell is a 57 y.o. year old patient with clinically localized prostate cancer.  After a thorough review of the management options for treatment of prostate cancer, he elected to proceed with surgical therapy and the above procedure(s).  We have discussed the potential benefits and risks of the procedure, side effects of the proposed treatment, the likelihood of the patient achieving the goals of the procedure, and any potential problems that might occur during the procedure or recuperation. Informed consent has been obtained.  Description of procedure:  The patient was taken to the operating room and a general anesthetic was administered. He was given preoperative antibiotics, placed in the dorsal lithotomy position, and prepped and draped in the usual sterile fashion. Next a preoperative timeout was performed. A urethral catheter was placed into the bladder and a site was selected near the umbilicus for placement of the camera  port. This was placed using a standard open Hassan technique which allowed entry into the peritoneal cavity under direct vision and without difficulty. An 8 mm robotic port was placed and a pneumoperitoneum established. The camera was then used to inspect the abdomen and there was no evidence of any intra-abdominal injuries or other abnormalities. The remaining abdominal ports were then placed. 8 mm robotic ports were placed in the right lower quadrant, left lower quadrant, and far left lateral abdominal wall. A 5 mm port was placed in the right upper quadrant and a 12 mm port was placed in the right lateral abdominal wall for laparoscopic assistance. All ports were placed under direct vision without difficulty. The surgical cart was then docked.   Utilizing the cautery scissors, the bladder was reflected posteriorly allowing entry into the space of Retzius and identification of the endopelvic fascia and prostate. The periprostatic fat was then removed from the prostate allowing full exposure of the endopelvic fascia. The endopelvic fascia was then incised from the apex back to the base of the prostate bilaterally and the underlying levator muscle fibers were swept laterally off the prostate thereby isolating the dorsal venous complex. The dorsal vein was then stapled and divided with a 45 mm Flex Echelon stapler. Attention then turned to the bladder neck which was divided anteriorly thereby allowing entry into the bladder and exposure of the urethral catheter. The catheter balloon was deflated and the catheter was brought into the operative field and used to retract the prostate anteriorly. The posterior bladder neck was then examined and was divided allowing further dissection between the bladder and prostate posteriorly until the vasa deferentia and seminal vessels were identified. The vasa deferentia were isolated, divided, and lifted anteriorly. The seminal vesicles were dissected down to  their tips with care  to control the seminal vascular arterial blood supply. These structures were then lifted anteriorly and the space between Denonvillier's fascia and the anterior rectum was developed with a combination of sharp and blunt dissection. This isolated the vascular pedicles of the prostate.  The lateral prostatic fascia was then sharply incised allowing release of the neurovascular bundles bilaterally. The vascular pedicles of the prostate were then ligated with Weck clips between the prostate and neurovascular bundles and divided with sharp cold scissor dissection resulting in neurovascular bundle preservation. The neurovascular bundles were then separated off the apex of the prostate and urethra bilaterally.  The urethra was then sharply transected allowing the prostate specimen to be disarticulated. The pelvis was copiously irrigated and hemostasis was ensured. There was no evidence for rectal injury.  Attention then turned to the right pelvic sidewall. The fibrofatty tissue between the external iliac vein, confluence of the iliac vessels, hypogastric artery, and Cooper's ligament was dissected free from the pelvic sidewall with care to preserve the obturator nerve. Weck clips were used for lymphostasis and hemostasis. An identical procedure was performed on the contralateral side and the lymphatic packets were removed for permanent pathologic analysis.  A 2-0 Vicryl figure of 8 suture was placed to help with additional hemostasis of the dorsal vein. Attention then turned to the urethral anastomosis. A 2-0 Vicryl slip knot was placed between Denonvillier's fascia, the posterior bladder neck, and the posterior urethra to reapproximate these structures. A double-armed 3-0 Monocryl suture was then used to perform a 360 running tension-free anastomosis between the bladder neck and urethra. A new urethral catheter was then placed into the bladder and irrigated. There were no blood clots within the bladder and the  anastomosis appeared to be watertight. A #19 Blake drain was then brought through the left lateral 8 mm port site and positioned appropriately within the pelvis. It was secured to the skin with a nylon suture. The surgical cart was then undocked. The right lateral 12 mm port site was closed at the fascial level with a 0 Vicryl suture placed laparoscopically. All remaining ports were then removed under direct vision. The prostate specimen was removed intact within the Endopouch retrieval bag via the periumbilical camera port site. This fascial opening was closed with two running 0 PDS sutures. 0.25% Marcaine was then injected into all port sites and all incisions were reapproximated at the skin level with 4-0 Monocryl subcuticular sutures and Dermabond. The patient appeared to tolerate the procedure well and without complications. The patient was able to be extubated and transferred to the recovery unit in satisfactory condition.   Moody Bruins MD

## 2022-10-21 NOTE — Progress Notes (Signed)
Patient ID: Gerald Maxwell, male   DOB: 01/19/1965, 57 y.o.   MRN: [id number]  Post-op note  Subjective: The patient is doing well.  No complaints.  Objective: Vital signs in last 24 hours: Temp:  [97.5 F (36.4 C)-98.4 F (36.9 C)] 97.5 F (36.4 C) (10/17 1500) Pulse Rate:  [63-102] 67 (10/17 1500) Resp:  [13-17] 17 (10/17 1500) BP: (102-147)/(65-89) 135/73 (10/17 1500) SpO2:  [93 %-98 %] 95 % (10/17 1500) Weight:  [72.1 kg] 72.1 kg (10/17 0913)  Intake/Output from previous day: No intake/output data recorded. Intake/Output this shift: Total I/O In: 1200 [I.V.:1000; IV Piggyback:200] Out: 120 [Urine:10; Drains:10; Blood:100]  Physical Exam:  General: Alert and oriented. Abdomen: Soft, Nondistended. Incisions: Clean and dry. GU: Minimal UOP so far.  Lab Results: Recent Labs    10/21/22 1418  HGB 13.1  HCT 36.4*    Assessment/Plan: POD#0   1) Continue to monitor, ambulate, IS 2) Continue IVF until UOP has improved   Rolly Salter, Montez Hageman. MD   LOS: 0 days   Crecencio Mc 10/21/2022, 3:19 PM

## 2022-10-22 ENCOUNTER — Encounter (HOSPITAL_COMMUNITY): Payer: Self-pay | Admitting: Urology

## 2022-10-22 DIAGNOSIS — C61 Malignant neoplasm of prostate: Secondary | ICD-10-CM | POA: Diagnosis not present

## 2022-10-22 LAB — HEMOGLOBIN AND HEMATOCRIT, BLOOD
HCT: 32.8 % — ABNORMAL LOW (ref 39.0–52.0)
Hemoglobin: 11.8 g/dL — ABNORMAL LOW (ref 13.0–17.0)

## 2022-10-22 MED ORDER — BISACODYL 10 MG RE SUPP: 10.0000 mg | Freq: Once | RECTAL | Status: DC

## 2022-10-22 MED ORDER — CHLORHEXIDINE GLUCONATE CLOTH 2 % EX PADS
6.0000 | MEDICATED_PAD | Freq: Every day | CUTANEOUS | Status: DC
  Administered 2022-10-22: 6 via TOPICAL

## 2022-10-22 MED ORDER — TRAMADOL HCL 50 MG PO TABS: 50.0000 mg | ORAL_TABLET | Freq: Four times a day (QID) | ORAL | Status: DC | PRN

## 2022-10-22 NOTE — Discharge Summary (Signed)
  Date of admission: 10/21/2022  Date of discharge: 10/22/2022  Admission diagnosis: Prostate Cancer  Discharge diagnosis: Prostate Cancer  History and Physical: For full details, please see admission history and physical. Briefly, Gerald Maxwell is a 57 y.o. gentleman with localized prostate cancer.  After discussing management/treatment options, he elected to proceed with surgical treatment.  Hospital Course: OLEGARIO DOCTOR was taken to the operating room on 10/21/2022 and underwent a robotic assisted laparoscopic radical prostatectomy. He tolerated this procedure well and without complications. Postoperatively, he was able to be transferred to a regular hospital room following recovery from anesthesia.  He was able to begin ambulating the night of surgery. He remained hemodynamically stable overnight.  He had excellent urine output with appropriately minimal output from his pelvic drain and his pelvic drain was removed on POD #1.  He was transitioned to oral pain medication, tolerated a clear liquid diet, and had met all discharge criteria and was able to be discharged home later on POD#1.  Laboratory values:  Recent Labs    10/21/22 1418 10/22/22 0336  HGB 13.1 11.8*  HCT 36.4* 32.8*    Disposition: Home  Discharge instruction: He was instructed to be ambulatory but to refrain from heavy lifting, strenuous activity, or driving. He was instructed on urethral catheter care.  Discharge medications:   Allergies as of 10/22/2022   No Known Allergies      Medication List     STOP taking these medications    cyanocobalamin 1000 MCG tablet Commonly known as: VITAMIN B12   multivitamin with minerals Tabs tablet       TAKE these medications    acetaminophen 500 MG tablet Commonly known as: TYLENOL Take 1,000-1,500 mg by mouth every 8 (eight) hours as needed for moderate pain.   allopurinol 300 MG tablet Commonly known as: ZYLOPRIM Take 300 mg by mouth daily.    chlorthalidone 25 MG tablet Commonly known as: HYGROTON Take 25 mg by mouth daily.   docusate sodium 100 MG capsule Commonly known as: COLACE Take 1 capsule (100 mg total) by mouth 2 (two) times daily.   lisinopril 40 MG tablet Commonly known as: ZESTRIL Take 40 mg by mouth daily.   sulfamethoxazole-trimethoprim 800-160 MG tablet Commonly known as: BACTRIM DS Take 1 tablet by mouth 2 (two) times daily. Start the day prior to foley removal appointment   tadalafil 20 MG tablet Commonly known as: CIALIS Take 20 mg by mouth daily as needed for erectile dysfunction.   traMADol 50 MG tablet Commonly known as: Ultram Take 1-2 tablets (50-100 mg total) by mouth every 6 (six) hours as needed for moderate pain (pain score 4-6) or severe pain (pain score 7-10).   verapamil 240 MG CR tablet Commonly known as: CALAN-SR Take 240 mg by mouth daily.        Followup: He will followup in 1 week for catheter removal and to discuss his surgical pathology results.

## 2022-10-22 NOTE — Progress Notes (Signed)
AVS given to patient and explained at the bedside. Medications and follow up appointments have been explained with pt verbalizing understanding. Foley teaching provided at bedside with pt providing teachback.

## 2022-10-22 NOTE — Plan of Care (Signed)
  Problem: Education: Goal: Understanding of post-operative needs will improve Outcome: Progressing Goal: Individualized Educational Video(s) Outcome: Progressing   Problem: Clinical Measurements: Goal: Postoperative complications will be avoided or minimized Outcome: Progressing   Problem: Respiratory: Goal: Will regain and/or maintain adequate ventilation Outcome: Progressing   Problem: Education: Goal: Knowledge of the procedure and recovery process will improve Outcome: Progressing   Problem: Bowel/Gastric: Goal: Gastrointestinal status for postoperative course will improve Outcome: Progressing   Problem: Pain Management: Goal: General experience of comfort will improve Outcome: Progressing   Problem: Skin Integrity: Goal: Demonstration of wound healing without infection will improve Outcome: Progressing   Problem: Urinary Elimination: Goal: Ability to avoid or minimize complications of infection will improve Outcome: Progressing Goal: Ability to achieve and maintain urine output will improve Outcome: Progressing Goal: Home care management will improve Outcome: Progressing   Problem: Education: Goal: Knowledge of General Education information will improve Description: Including pain rating scale, medication(s)/side effects and non-pharmacologic comfort measures Outcome: Progressing   Problem: Health Behavior/Discharge Planning: Goal: Ability to manage health-related needs will improve Outcome: Progressing   Problem: Clinical Measurements: Goal: Ability to maintain clinical measurements within normal limits will improve Outcome: Progressing Goal: Will remain free from infection Outcome: Progressing Goal: Diagnostic test results will improve Outcome: Progressing Goal: Respiratory complications will improve Outcome: Progressing Goal: Cardiovascular complication will be avoided Outcome: Progressing   Problem: Activity: Goal: Risk for activity intolerance  will decrease Outcome: Progressing   Problem: Nutrition: Goal: Adequate nutrition will be maintained Outcome: Progressing   Problem: Coping: Goal: Level of anxiety will decrease Outcome: Progressing   Problem: Elimination: Goal: Will not experience complications related to bowel motility Outcome: Progressing Goal: Will not experience complications related to urinary retention Outcome: Progressing   Problem: Pain Managment: Goal: General experience of comfort will improve Outcome: Progressing   Problem: Safety: Goal: Ability to remain free from injury will improve Outcome: Progressing   Problem: Skin Integrity: Goal: Risk for impaired skin integrity will decrease Outcome: Progressing

## 2022-10-22 NOTE — Progress Notes (Signed)
Patient ID: Gerald Maxwell, male   DOB: [birth year], 57 y.o.   MRN: [id number]  1 Day Post-Op Subjective: The patient is doing well.  No nausea or vomiting. Pain is adequately controlled.  Objective: Vital signs in last 24 hours: Temp:  [97.5 F (36.4 C)-99.5 F (37.5 C)] 98.5 F (36.9 C) (10/18 0401) Pulse Rate:  [60-102] 60 (10/18 0401) Resp:  [13-20] 18 (10/18 0401) BP: (102-147)/(65-89) 138/79 (10/18 0401) SpO2:  [92 %-100 %] 94 % (10/18 0401) Weight:  [72.1 kg] 72.1 kg (10/17 0913)  Intake/Output from previous day: 10/17 0701 - 10/18 0700 In: 3498.4 [P.O.:480; I.V.:1774.8; IV Piggyback:1243.6] Out: 1675 [Urine:1435; Drains:140; Blood:100] Intake/Output this shift: No intake/output data recorded.  Physical Exam:  General: Alert and oriented. CV: RRR Lungs: Clear bilaterally. GI: Soft, Nondistended. Incisions: Clean, dry, and intact Urine: Clear Extremities: Nontender, no erythema, no edema.  Lab Results: Recent Labs    10/21/22 1418 10/22/22 0336  HGB 13.1 11.8*  HCT 36.4* 32.8*      Assessment/Plan: POD# 1 s/p robotic prostatectomy.  1) SL IVF 2) Ambulate, Incentive spirometry 3) Transition to oral pain medication 4) Dulcolax suppository 5) D/C pelvic drain 6) Plan for likely discharge later today   Moody Bruins. MD   LOS: 0 days   Crecencio Mc 10/22/2022, 8:14 AM

## 2022-10-27 LAB — SURGICAL PATHOLOGY

## 2022-11-02 ENCOUNTER — Telehealth: Payer: Self-pay

## 2022-11-02 NOTE — Telephone Encounter (Signed)
Please call patient to schedule an appointment with Dr. Magnus Ivan to discuss surgery.

## 2022-11-24 ENCOUNTER — Ambulatory Visit: Payer: Managed Care, Other (non HMO) | Admitting: Orthopaedic Surgery

## 2022-12-01 NOTE — H&P (Signed)
TOTAL HIP ADMISSION H&P  Patient is admitted for left total hip arthroplasty.  Subjective:  Chief Complaint: Left hip pain  HPI: Gerald Maxwell, 57 y.o. male, has a history of pain and functional disability in the left hip due to arthritis and patient has failed non-surgical conservative treatments for greater than 12 weeks to include use of assistive devices and activity modification. Onset of symptoms was gradual, starting 3 years ago with gradually worsening course since that time. The patient noted no past surgery on the left hip. Patient currently rates pain in the left hip at 8 out of 10 with activity. Patient has night pain and pain that interfers with activities of daily living. Patient has evidence of periarticular osteophytes and joint space narrowing by imaging studies. This condition presents safety issues increasing the risk of falls. There is no current active infection.  Patient Active Problem List   Diagnosis Date Noted  . Prostate cancer (HCC) 10/21/2022  . Carotid artery stenosis 10/19/2022  . Colon cancer screening 07/12/2022  . Erectile dysfunction 07/12/2022  . Family history of malignant neoplasm of digestive organs 07/12/2022  . Gouty arthritis 07/12/2022  . History of colonic polyps 07/12/2022  . Malignant neoplasm of prostate (HCC) 07/12/2022  . Essential hypertension 02/16/2022  . Irregular heartbeat 02/16/2022  . Nicotine dependence, cigarettes, uncomplicated 02/16/2022  . Left inguinal hernia 10/06/2020  . Seborrheic keratosis 02/15/2019  . Lumbar pain 12/19/2018  . Spinal stenosis, lumbar region with neurogenic claudication 11/15/2018  . Gastric ulcer with perforation (HCC) 07/23/2018  . History of gastric ulcer 07/23/2018  . Numbness and tingling in both hands 08/17/2017  . Macrocytosis without anemia 04/28/2017    Past Medical History:  Diagnosis Date  . Arthritis   . Gout   . High cholesterol   . Hypertension   . Prostate cancer (HCC)   .  Stomach ulcer     Past Surgical History:  Procedure Laterality Date  . APPENDECTOMY    . hole in stomach surgery     . KNEE ARTHROSCOPY Bilateral   . LUMBAR LAMINECTOMY/DECOMPRESSION MICRODISCECTOMY N/A 11/15/2018   Procedure: Central decompression lumbar laminectomy spinal stenosis L5-S1, microdiscectomy L5-S1 left, foramonotomy L5-S1 nerve root left;  Surgeon: Ranee Gosselin, MD;  Location: [facility];  Service: Orthopedics;  Laterality: N/A;   . LYMPHADENECTOMY Bilateral 10/21/2022   Procedure: BILATERAL PELVIC LYMPHADENECTOMY;  Surgeon: Heloise Purpura, MD;  Location: [facility];  Service: Urology;  Laterality: Bilateral;  . ROBOT ASSISTED LAPAROSCOPIC RADICAL PROSTATECTOMY N/A 10/21/2022   Procedure: XI ROBOTIC ASSISTED LAPAROSCOPIC RADICAL PROSTATECTOMY LEVEL 3;  Surgeon: Heloise Purpura, MD;  Location: [facility];  Service: Urology;  Laterality: N/A;  . UMBILICAL HERNIA REPAIR     x 3    Prior to Admission medications   Medication Sig Start Date End Date Taking? Authorizing Provider  acetaminophen (TYLENOL) 500 MG tablet Take 1,000-1,500 mg by mouth every 8 (eight) hours as needed for moderate pain.    [provider]  allopurinol (ZYLOPRIM) 300 MG tablet Take 300 mg by mouth daily.    [provider]  chlorthalidone (HYGROTON) 25 MG tablet Take 25 mg by mouth daily.    [provider]  docusate sodium (COLACE) 100 MG capsule Take 1 capsule (100 mg total) by mouth 2 (two) times daily. 10/21/22   Harrie Foreman, PA-C  lisinopril (ZESTRIL) 40 MG tablet Take 40 mg by mouth daily.    [provider]  sulfamethoxazole-trimethoprim (BACTRIM DS) 800-160 MG tablet Take 1 tablet  by mouth 2 (two) times daily. Start the day prior to foley removal appointment 10/21/22   Harrie Foreman, PA-C  tadalafil (CIALIS) 20 MG tablet Take 20 mg by mouth daily as needed for erectile dysfunction. 07/23/20   [provider]  traMADol (ULTRAM) 50 MG tablet Take 1-2 tablets  (50-100 mg total) by mouth every 6 (six) hours as needed for moderate pain (pain score 4-6) or severe pain (pain score 7-10). 10/21/22   Harrie Foreman, PA-C  verapamil (CALAN-SR) 240 MG CR tablet Take 240 mg by mouth daily.     [provider]    No Known Allergies  Social History   Socioeconomic History  . Marital status: Married    Spouse name: Not on file  . Number of children: Not on file  . Years of education: Not on file  . Highest education level: Not on file  Occupational History  . Not on file  Tobacco Use  . Smoking status: Every Day    Current packs/day: 0.50    Types: Cigarettes  . Smokeless tobacco: Former  Advertising account planner  . Vaping status: Never Used  Substance and Sexual Activity  . Alcohol use: Yes    Alcohol/week: 12.0 standard drinks of alcohol    Types: 12 Shots of liquor per week    Comment: average 3-4 per day beer or bourbon  . Drug use: Never  . Sexual activity: Yes  Other Topics Concern  . Not on file  Social History Narrative  . Not on file   Social Determinants of Health   Financial Resource Strain: Low Risk  (02/16/2022)   Received from [facility], [facility]   Overall Financial Resource Strain (CARDIA)   . Difficulty of Paying Living Expenses: Not very hard  Food Insecurity: No Food Insecurity (10/21/2022)   Hunger Vital Sign   . Worried About Programme researcher, broadcasting/film/video in the Last Year: Never true   . Ran Out of Food in the Last Year: Never true  Transportation Needs: No Transportation Needs (10/21/2022)   PRAPARE - Transportation   . Lack of Transportation (Medical): No   . Lack of Transportation (Non-Medical): No  Physical Activity: Insufficiently Active (11/10/2020)   Received from [facility], [facility]   Exercise Vital Sign   . Days of Exercise per Week: 1 day   . Minutes of Exercise per Session: 20 min  Stress: Stress Concern Present (11/10/2020)   Received from [facility], [facility]  Occupational Health - Occupational Stress Questionnaire   . Feeling of Stress : To some extent  Social Connections: Unknown (05/18/2021)   Received from [facility], [facility]   Social Network   . Social Network: Not on file  Intimate Partner Violence: Not At Risk (10/21/2022)   Humiliation, Afraid, Rape, and Kick questionnaire   . Fear of Current or Ex-Partner: No   . Emotionally Abused: No   . Physically Abused: No   . Sexually Abused: No    Tobacco Use: High Risk (10/21/2022)   Patient History   . Smoking Tobacco Use: Every Day   . Smokeless Tobacco Use: Former   . Passive Exposure: Not on file   Social History   Substance and Sexual Activity  Alcohol Use Yes  . Alcohol/week: 12.0 standard drinks of alcohol  . Types: 12 Shots of liquor per week   Comment: average 3-4 per day beer or bourbon    No family history on file.  ROS  Objective:  Physical Exam: - Well-developed male, alert, oriented, in no apparent distress.  - Right hip has normal range of motion with no discomfort.  - Left hip can be flexed to 100 degrees with minimal internal rotation, 20 degrees of external rotation, and 20 degrees of abduction.  - Antalgic gait pattern on the left.    IMAGING:  Review of radiographs from September 2024 demonstrates severe bone-on-bone arthritis in the left hip with subchondral cystic changes and erosion of the femoral head.  Assessment/Plan:  End stage arthritis, left hip  The patient history, physical examination, clinical judgement of the provider and imaging studies are consistent with end stage degenerative joint disease of the left hip and total hip arthroplasty is deemed medically necessary. The treatment options including medical management, injection therapy, arthroscopy and arthroplasty were discussed at length. The risks and benefits of total hip arthroplasty were presented and reviewed. The risks due to aseptic loosening, infection, stiffness,  dislocation/subluxation, thromboembolic complications and other imponderables were discussed. The patient acknowledged the explanation, agreed to proceed with the plan and consent was signed. Patient is being admitted for inpatient treatment for surgery, pain control, PT, OT, prophylactic antibiotics, VTE prophylaxis, progressive ambulation and ADLs and discharge planning.The patient is planning to be discharged home.   Patient's anticipated LOS is less than 2 midnights, meeting these requirements: - Younger than 35 - Lives within 1 hour of care - Has a competent adult at home to recover with post-op recover - NO history of  - Chronic pain requiring opiods  - Diabetes  - Coronary Artery Disease  - Heart failure  - Heart attack  - Stroke  - DVT/VTE  - Cardiac arrhythmia  - Respiratory Failure/COPD  - Renal failure  - Anemia  - Advanced Liver disease    Therapy Plans: HEP Disposition: Home with wife Planned DVT Prophylaxis: Xarelto (hx prostate cancer) DME Needed: None PCP: Ladora Daniel, PA-C (clearance received) TXA: IV Allergies: NKDA Anesthesia Concerns: None BMI: 22.7 Last HgbA1c: Not diabetic Pharmacy: [facility] [place]  Other: -Nausea with oxycodone, prefers not to take -RX for tramadol at night prn sent today.  - Patient was instructed on what medications to stop prior to surgery. - Follow-up visit in 2 weeks with Dr. Lequita Halt - Begin physical therapy following surgery - Pre-operative lab work as pre-surgical testing - Prescriptions will be provided in hospital at time of discharge  Weston Brass, PA-C Orthopedic Surgery [facility] [place]

## 2022-12-13 NOTE — Patient Instructions (Signed)
DUE TO COVID-19 ONLY TWO VISITORS  (aged 59 and older)  ARE ALLOWED TO COME WITH YOU AND STAY IN THE WAITING ROOM ONLY DURING PRE OP AND PROCEDURE.   **NO VISITORS ARE ALLOWED IN THE SHORT STAY AREA OR RECOVERY ROOM!!**  IF YOU WILL BE ADMITTED INTO THE HOSPITAL YOU ARE ALLOWED ONLY FOUR SUPPORT PEOPLE DURING VISITATION HOURS ONLY (7 AM -8PM)   The support person(s) must pass our screening, gel in and out, and wear a mask at all times, including in the patient's room. Patients must also wear a mask when staff or their support person are in the room. Visitors GUEST BADGE MUST BE WORN VISIBLY  One adult visitor may remain with you overnight and MUST be in the room by 8 P.M.     Your procedure is scheduled on: 12/22/22   Report to [facility] Main Entrance    Report to admitting at : 10:00 AM   Call this number if you have problems the morning of surgery [phone]   Do not eat food :After Midnight.   After Midnight you may have the following liquids until: 9:30 AM DAY OF SURGERY  Water Black Coffee (sugar ok, NO MILK/CREAM OR CREAMERS)  Tea (sugar ok, NO MILK/CREAM OR CREAMERS) regular and decaf                             Plain Jell-O (NO RED)                                           Fruit ices (not with fruit pulp, NO RED)                                     Popsicles (NO RED)                                                                  Juice: apple, WHITE grape, WHITE cranberry Sports drinks like Gatorade (NO RED)   The day of surgery:  Drink ONE (1) Pre-Surgery Clear Ensure at : 9:30 AM the morning of surgery. Drink in one sitting. Do not sip.  This drink was given to you during your hospital  pre-op appointment visit. Nothing else to drink after completing the  Pre-Surgery Clear Ensure or G2.          If you have questions, please contact your surgeon's office.  FOLLOW ANY ADDITIONAL PRE OP INSTRUCTIONS YOU RECEIVED FROM YOUR SURGEON'S OFFICE!!!   Oral  Hygiene is also important to reduce your risk of infection.                                    Remember - BRUSH YOUR TEETH THE MORNING OF SURGERY WITH YOUR REGULAR TOOTHPASTE  DENTURES WILL BE REMOVED PRIOR TO SURGERY PLEASE DO NOT APPLY "Poly grip" OR ADHESIVES!!!   Do NOT smoke after Midnight   Take these medicines the morning of surgery with  A SIP OF WATER: allopurinol.                              You may not have any metal on your body including hair pins, jewelry, and body piercing             Do not wear lotions, powders, perfumes/cologne, or deodorant              Men may shave face and neck.   Do not bring valuables to the hospital. [facility] IS NOT             RESPONSIBLE   FOR VALUABLES.   Contacts, glasses, or bridgework may not be worn into surgery.   Bring small overnight bag day of surgery.   DO NOT BRING YOUR HOME MEDICATIONS TO THE HOSPITAL. PHARMACY WILL DISPENSE MEDICATIONS LISTED ON YOUR MEDICATION LIST TO YOU DURING YOUR ADMISSION IN THE HOSPITAL!    Patients discharged on the day of surgery will not be allowed to drive home.  Someone NEEDS to stay with you for the first 24 hours after anesthesia.   Special Instructions: Bring a copy of your healthcare power of attorney and living will documents         the day of surgery if you haven't scanned them before.              Please read over the following fact sheets you were given: IF YOU HAVE QUESTIONS ABOUT YOUR PRE-OP INSTRUCTIONS PLEASE CALL [phone]      Pre-operative 5 CHG Bath Instructions   You can play a key role in reducing the risk of infection after surgery. Your skin needs to be as free of germs as possible. You can reduce the number of germs on your skin by washing with CHG (chlorhexidine gluconate) soap before surgery. CHG is an antiseptic soap that kills germs and continues to kill germs even after washing.   DO NOT use if you have an allergy to chlorhexidine/CHG or antibacterial soaps. If  your skin becomes reddened or irritated, stop using the CHG and notify one of our RNs at : [phone].   Please shower with the CHG soap starting 4 days before surgery using the following schedule:     Please keep in mind the following:  DO NOT shave, including legs and underarms, starting the day of your first shower.   You may shave your face at any point before/day of surgery.  Place clean sheets on your bed the day you start using CHG soap. Use a clean washcloth (not used since being washed) for each shower. DO NOT sleep with pets once you start using the CHG.   CHG Shower Instructions:  If you choose to wash your hair and private area, wash first with your normal shampoo/soap.  After you use shampoo/soap, rinse your hair and body thoroughly to remove shampoo/soap residue.  Turn the water OFF and apply about 3 tablespoons (45 ml) of CHG soap to a CLEAN washcloth.  Apply CHG soap ONLY FROM YOUR NECK DOWN TO YOUR TOES (washing for 3-5 minutes)  DO NOT use CHG soap on face, private areas, open wounds, or sores.  Pay special attention to the area where your surgery is being performed.  If you are having back surgery, having someone wash your back for you may be helpful. Wait 2 minutes after CHG soap is applied, then you may rinse off the  CHG soap.  Pat dry with a clean towel  Put on clean clothes/pajamas   If you choose to wear lotion, please use ONLY the CHG-compatible lotions on the back of this paper.     Additional instructions for the day of surgery: DO NOT APPLY any lotions, deodorants, cologne, or perfumes.   Put on clean/comfortable clothes.  Brush your teeth.  Ask your nurse before applying any prescription medications to the skin.   CHG Compatible Lotions   Aveeno Moisturizing lotion  Cetaphil Moisturizing Cream  Cetaphil Moisturizing Lotion  Clairol Herbal Essence Moisturizing Lotion, Dry Skin  Clairol Herbal Essence Moisturizing Lotion, Extra Dry Skin  Clairol  Herbal Essence Moisturizing Lotion, Normal Skin  Curel Age Defying Therapeutic Moisturizing Lotion with Alpha Hydroxy  Curel Extreme Care Body Lotion  Curel Soothing Hands Moisturizing Hand Lotion  Curel Therapeutic Moisturizing Cream, Fragrance-Free  Curel Therapeutic Moisturizing Lotion, Fragrance-Free  Curel Therapeutic Moisturizing Lotion, Original Formula  Eucerin Daily Replenishing Lotion  Eucerin Dry Skin Therapy Plus Alpha Hydroxy Crme  Eucerin Dry Skin Therapy Plus Alpha Hydroxy Lotion  Eucerin Original Crme  Eucerin Original Lotion  Eucerin Plus Crme Eucerin Plus Lotion  Eucerin TriLipid Replenishing Lotion  Keri Anti-Bacterial Hand Lotion  Keri Deep Conditioning Original Lotion Dry Skin Formula Softly Scented  Keri Deep Conditioning Original Lotion, Fragrance Free Sensitive Skin Formula  Keri Lotion Fast Absorbing Fragrance Free Sensitive Skin Formula  Keri Lotion Fast Absorbing Softly Scented Dry Skin Formula  Keri Original Lotion  Keri Skin Renewal Lotion Keri Silky Smooth Lotion  Keri Silky Smooth Sensitive Skin Lotion  Nivea Body Creamy Conditioning Oil  Nivea Body Extra Enriched Lotion  Nivea Body Original Lotion  Nivea Body Sheer Moisturizing Lotion Nivea Crme  Nivea Skin Firming Lotion  NutraDerm 30 Skin Lotion  NutraDerm Skin Lotion  NutraDerm Therapeutic Skin Cream  NutraDerm Therapeutic Skin Lotion  ProShield Protective Hand Cream  Provon moisturizing lotion   Incentive Spirometer  An incentive spirometer is a tool that can help keep your lungs clear and active. This tool measures how well you are filling your lungs with each breath. Taking long deep breaths may help reverse or decrease the chance of developing breathing (pulmonary) problems (especially infection) following: A long period of time when you are unable to move or be active. BEFORE THE PROCEDURE  If the spirometer includes an indicator to show your best effort, your nurse or respiratory  therapist will set it to a desired goal. If possible, sit up straight or lean slightly forward. Try not to slouch. Hold the incentive spirometer in an upright position. INSTRUCTIONS FOR USE  Sit on the edge of your bed if possible, or sit up as far as you can in bed or on a chair. Hold the incentive spirometer in an upright position. Breathe out normally. Place the mouthpiece in your mouth and seal your lips tightly around it. Breathe in slowly and as deeply as possible, raising the piston or the ball toward the top of the column. Hold your breath for 3-5 seconds or for as long as possible. Allow the piston or ball to fall to the bottom of the column. Remove the mouthpiece from your mouth and breathe out normally. Rest for a few seconds and repeat Steps 1 through 7 at least 10 times every 1-2 hours when you are awake. Take your time and take a few normal breaths between deep breaths. The spirometer may include an indicator to show your best effort. Use the indicator as  a goal to work toward during each repetition. After each set of 10 deep breaths, practice coughing to be sure your lungs are clear. If you have an incision (the cut made at the time of surgery), support your incision when coughing by placing a pillow or rolled up towels firmly against it. Once you are able to get out of bed, walk around indoors and cough well. You may stop using the incentive spirometer when instructed by your caregiver.  RISKS AND COMPLICATIONS Take your time so you do not get dizzy or light-headed. If you are in pain, you may need to take or ask for pain medication before doing incentive spirometry. It is harder to take a deep breath if you are having pain. AFTER USE Rest and breathe slowly and easily. It can be helpful to keep track of a log of your progress. Your caregiver can provide you with a simple table to help with this. If you are using the spirometer at home, follow these instructions: SEEK MEDICAL  CARE IF:  You are having difficultly using the spirometer. You have trouble using the spirometer as often as instructed. Your pain medication is not giving enough relief while using the spirometer. You develop fever of 100.5 F (38.1 C) or higher. SEEK IMMEDIATE MEDICAL CARE IF:  You cough up bloody sputum that had not been present before. You develop fever of 102 F (38.9 C) or greater. You develop worsening pain at or near the incision site. MAKE SURE YOU:  Understand these instructions. Will watch your condition. Will get help right away if you are not doing well or get worse. Document Released: 05/03/2006 Document Revised: 03/15/2011 Document Reviewed: 07/04/2006 [facility] Patient Information 2014 [place], [place].   ________________________________________________________________________

## 2022-12-14 ENCOUNTER — Other Ambulatory Visit: Payer: Self-pay

## 2022-12-14 ENCOUNTER — Encounter (HOSPITAL_COMMUNITY)
Admission: RE | Admit: 2022-12-14 | Discharge: 2022-12-14 | Disposition: A | Discharge status: Home / Self Care | Payer: Managed Care, Other (non HMO) | Source: Ambulatory Visit | Attending: Orthopedic Surgery

## 2022-12-14 ENCOUNTER — Encounter (HOSPITAL_COMMUNITY): Payer: Self-pay

## 2022-12-14 VITALS — BP 121/78 | HR 100 | Temp 98.2°F | Ht 68.0 in | Wt 152.0 lb

## 2022-12-14 DIAGNOSIS — I10 Essential (primary) hypertension: Secondary | ICD-10-CM | POA: Insufficient documentation

## 2022-12-14 DIAGNOSIS — Z01818 Encounter for other preprocedural examination: Secondary | ICD-10-CM

## 2022-12-14 DIAGNOSIS — Z01812 Encounter for preprocedural laboratory examination: Secondary | ICD-10-CM | POA: Insufficient documentation

## 2022-12-14 LAB — CBC
HCT: 38.9 % — ABNORMAL LOW (ref 39.0–52.0)
Hemoglobin: 13.9 g/dL (ref 13.0–17.0)
MCH: 37.9 pg — ABNORMAL HIGH (ref 26.0–34.0)
MCHC: 35.7 g/dL (ref 30.0–36.0)
MCV: 106 fL — ABNORMAL HIGH (ref 80.0–100.0)
Platelets: 164 10*3/uL (ref 150–400)
RBC: 3.67 MIL/uL — ABNORMAL LOW (ref 4.22–5.81)
RDW: 15.8 % — ABNORMAL HIGH (ref 11.5–15.5)
WBC: 9.2 10*3/uL (ref 4.0–10.5)
nRBC: 0 % (ref 0.0–0.2)

## 2022-12-14 LAB — SURGICAL PCR SCREEN
MRSA, PCR: NEGATIVE
Staphylococcus aureus: NEGATIVE

## 2022-12-14 LAB — BASIC METABOLIC PANEL
Anion gap: 15 (ref 5–15)
BUN: 15 mg/dL (ref 6–20)
CO2: 23 mmol/L (ref 22–32)
Calcium: 9.9 mg/dL (ref 8.9–10.3)
Chloride: 94 mmol/L — ABNORMAL LOW (ref 98–111)
Creatinine, Ser: 0.98 mg/dL (ref 0.61–1.24)
GFR, Estimated: >60 mL/min (ref >60)
Glucose, Bld: 99 mg/dL (ref 70–99)
Potassium: 3.8 mmol/L (ref 3.5–5.1)
Sodium: 132 mmol/L — ABNORMAL LOW (ref 135–145)

## 2022-12-14 NOTE — Progress Notes (Signed)
For Anesthesia: PCP -  Cardiologist -   Bowel Prep reminder:  Chest x-Beal, Sheri, PA-C ray -  EKG - 10/13/22 Stress Test -  ECHO -  Cardiac Cath -  Pacemaker/ICD device last checked: Pacemaker orders received: Device Rep notified:  Spinal Cord Stimulator:N/A  Sleep Study - N/A CPAP -   Fasting Blood Sugar - N/A Checks Blood Sugar _____ times a day Date and result of last Hgb A1c-  Last dose of GLP1 agonist- N/A GLP1 instructions:   Last dose of SGLT-2 inhibitors- N/A SGLT-2 instructions:   Blood Thinner Instructions:N/A Aspirin Instructions: Last Dose:  Activity level: Can go up a flight of stairs and activities of daily living without stopping and without chest pain and/or shortness of breath   Able to exercise without chest pain and/or shortness of breath  Anesthesia review: Hx: HTN,smoker.  Patient denies shortness of breath, fever, cough and chest pain at PAT appointment   Patient verbalized understanding of instructions that were given to them at the PAT appointment. Patient was also instructed that they will need to review over the PAT instructions again at home before surgery.

## 2022-12-22 ENCOUNTER — Ambulatory Visit (HOSPITAL_COMMUNITY): Payer: Managed Care, Other (non HMO)

## 2022-12-22 ENCOUNTER — Ambulatory Visit (HOSPITAL_COMMUNITY): Payer: Managed Care, Other (non HMO) | Admitting: Anesthesiology

## 2022-12-22 ENCOUNTER — Encounter (HOSPITAL_COMMUNITY): Payer: Self-pay | Admitting: Orthopedic Surgery

## 2022-12-22 ENCOUNTER — Observation Stay (HOSPITAL_COMMUNITY): Payer: Managed Care, Other (non HMO)

## 2022-12-22 ENCOUNTER — Other Ambulatory Visit: Payer: Self-pay

## 2022-12-22 ENCOUNTER — Encounter (HOSPITAL_COMMUNITY)
Admission: RE | Disposition: A | Discharge status: Home / Self Care | Payer: Self-pay | Source: Ambulatory Visit | Attending: Orthopedic Surgery

## 2022-12-22 ENCOUNTER — Observation Stay (HOSPITAL_COMMUNITY)
Admission: RE | Admit: 2022-12-22 | Discharge: 2022-12-23 | Disposition: A | Discharge status: Home / Self Care | Payer: Managed Care, Other (non HMO) | Source: Ambulatory Visit | Attending: Orthopedic Surgery | Admitting: Orthopedic Surgery

## 2022-12-22 DIAGNOSIS — F17210 Nicotine dependence, cigarettes, uncomplicated: Secondary | ICD-10-CM | POA: Diagnosis not present

## 2022-12-22 DIAGNOSIS — M1612 Unilateral primary osteoarthritis, left hip: Secondary | ICD-10-CM | POA: Diagnosis present

## 2022-12-22 DIAGNOSIS — Z79899 Other long term (current) drug therapy: Secondary | ICD-10-CM | POA: Insufficient documentation

## 2022-12-22 DIAGNOSIS — I10 Essential (primary) hypertension: Secondary | ICD-10-CM | POA: Insufficient documentation

## 2022-12-22 DIAGNOSIS — M169 Osteoarthritis of hip, unspecified: Principal | ICD-10-CM | POA: Diagnosis present

## 2022-12-22 DIAGNOSIS — Z8546 Personal history of malignant neoplasm of prostate: Secondary | ICD-10-CM | POA: Diagnosis not present

## 2022-12-22 HISTORY — PX: TOTAL HIP ARTHROPLASTY: SHX124

## 2022-12-22 LAB — TYPE AND SCREEN
ABO/RH(D): O POS
Antibody Screen: NEGATIVE

## 2022-12-22 SURGERY — ARTHROPLASTY, HIP, TOTAL, ANTERIOR APPROACH
Anesthesia: Monitor Anesthesia Care | Site: Hip | Laterality: Left

## 2022-12-22 MED ORDER — ACETAMINOPHEN 10 MG/ML IV SOLN
1000.0000 mg | Freq: Once | INTRAVENOUS | Status: AC
  Administered 2022-12-22: 1000 mg via INTRAVENOUS
  Filled 2022-12-22: qty 100

## 2022-12-22 MED ORDER — BUPIVACAINE IN DEXTROSE 0.75-8.25 % IT SOLN
INTRATHECAL | Status: DC | PRN
  Administered 2022-12-22: 1.8 mL via INTRATHECAL

## 2022-12-22 MED ORDER — PHENYLEPHRINE HCL (PRESSORS) 10 MG/ML IV SOLN
INTRAVENOUS | Status: DC | PRN
  Administered 2022-12-22 (×2): 160 ug via INTRAVENOUS
  Administered 2022-12-22: 80 ug via INTRAVENOUS
  Administered 2022-12-22: 160 ug via INTRAVENOUS
  Administered 2022-12-22: 80 ug via INTRAVENOUS
  Administered 2022-12-22: 160 ug via INTRAVENOUS
  Administered 2022-12-22: 80 ug via INTRAVENOUS

## 2022-12-22 MED ORDER — METOCLOPRAMIDE HCL 5 MG PO TABS: 5.0000 mg | ORAL_TABLET | Freq: Three times a day (TID) | ORAL | Status: DC | PRN

## 2022-12-22 MED ORDER — MENTHOL 3 MG MT LOZG: 1.0000 | LOZENGE | OROMUCOSAL | Status: DC | PRN

## 2022-12-22 MED ORDER — POVIDONE-IODINE 10 % EX SWAB
2.0000 | Freq: Once | CUTANEOUS | Status: AC
  Administered 2022-12-22: 2 via TOPICAL

## 2022-12-22 MED ORDER — MIDAZOLAM HCL 5 MG/5ML IJ SOLN
INTRAMUSCULAR | Status: DC | PRN
  Administered 2022-12-22 (×4): 1 mg via INTRAVENOUS

## 2022-12-22 MED ORDER — 0.9 % SODIUM CHLORIDE (POUR BTL) OPTIME
TOPICAL | Status: DC | PRN
  Administered 2022-12-22: 1000 mL

## 2022-12-22 MED ORDER — LACTATED RINGERS IV SOLN: INTRAVENOUS | Status: DC

## 2022-12-22 MED ORDER — METHOCARBAMOL 500 MG PO TABS
500.0000 mg | ORAL_TABLET | Freq: Four times a day (QID) | ORAL | Status: DC | PRN
  Administered 2022-12-23: 500 mg via ORAL
  Filled 2022-12-22: qty 1

## 2022-12-22 MED ORDER — MIDAZOLAM HCL 2 MG/2ML IJ SOLN
INTRAMUSCULAR | Status: AC
  Filled 2022-12-22: qty 2

## 2022-12-22 MED ORDER — PHENOL 1.4 % MT LIQD: 1.0000 | OROMUCOSAL | Status: DC | PRN

## 2022-12-22 MED ORDER — ONDANSETRON HCL 4 MG/2ML IJ SOLN
INTRAMUSCULAR | Status: DC | PRN
  Administered 2022-12-22: 4 mg via INTRAVENOUS

## 2022-12-22 MED ORDER — PROPOFOL 500 MG/50ML IV EMUL
INTRAVENOUS | Status: DC | PRN
  Administered 2022-12-22: 75 ug/kg/min via INTRAVENOUS

## 2022-12-22 MED ORDER — BISACODYL 10 MG RE SUPP: 10.0000 mg | Freq: Every day | RECTAL | Status: DC | PRN

## 2022-12-22 MED ORDER — AMISULPRIDE (ANTIEMETIC) 5 MG/2ML IV SOLN: 10.0000 mg | Freq: Once | INTRAVENOUS | Status: DC | PRN

## 2022-12-22 MED ORDER — CEFAZOLIN SODIUM-DEXTROSE 2-4 GM/100ML-% IV SOLN
2.0000 g | Freq: Four times a day (QID) | INTRAVENOUS | Status: AC
  Administered 2022-12-22 (×2): 2 g via INTRAVENOUS
  Filled 2022-12-22 (×2): qty 100

## 2022-12-22 MED ORDER — DEXAMETHASONE SODIUM PHOSPHATE 10 MG/ML IJ SOLN: 8.0000 mg | Freq: Once | INTRAMUSCULAR | Status: DC

## 2022-12-22 MED ORDER — ALLOPURINOL 300 MG PO TABS
300.0000 mg | ORAL_TABLET | Freq: Every day | ORAL | Status: DC
Start: 2022-12-23 — End: 2022-12-23
  Administered 2022-12-23: 300 mg via ORAL
  Filled 2022-12-22: qty 1

## 2022-12-22 MED ORDER — LIDOCAINE HCL (CARDIAC) PF 100 MG/5ML IV SOSY
PREFILLED_SYRINGE | INTRAVENOUS | Status: DC | PRN
  Administered 2022-12-22: 20 mg via INTRATRACHEAL

## 2022-12-22 MED ORDER — MAGNESIUM CITRATE PO SOLN: 1.0000 | Freq: Once | ORAL | Status: DC | PRN

## 2022-12-22 MED ORDER — BUPIVACAINE-EPINEPHRINE (PF) 0.5% -1:200000 IJ SOLN
INTRAMUSCULAR | Status: AC
  Filled 2022-12-22: qty 30

## 2022-12-22 MED ORDER — CEFAZOLIN SODIUM-DEXTROSE 2-4 GM/100ML-% IV SOLN
2.0000 g | INTRAVENOUS | Status: AC
  Administered 2022-12-22: 2 g via INTRAVENOUS
  Filled 2022-12-22: qty 100

## 2022-12-22 MED ORDER — TRAMADOL HCL 50 MG PO TABS
50.0000 mg | ORAL_TABLET | Freq: Four times a day (QID) | ORAL | Status: DC | PRN
  Administered 2022-12-22 – 2022-12-23 (×2): 100 mg via ORAL
  Filled 2022-12-22 (×2): qty 2

## 2022-12-22 MED ORDER — FENTANYL CITRATE (PF) 100 MCG/2ML IJ SOLN
INTRAMUSCULAR | Status: AC
  Filled 2022-12-22: qty 2

## 2022-12-22 MED ORDER — SODIUM CHLORIDE 0.9 % IV SOLN: INTRAVENOUS | Status: DC

## 2022-12-22 MED ORDER — MORPHINE SULFATE (PF) 2 MG/ML IV SOLN
0.5000 mg | INTRAVENOUS | Status: DC | PRN
  Administered 2022-12-23: 1 mg via INTRAVENOUS
  Filled 2022-12-22 (×2): qty 1

## 2022-12-22 MED ORDER — ONDANSETRON HCL 4 MG/2ML IJ SOLN
INTRAMUSCULAR | Status: AC
  Filled 2022-12-22: qty 2

## 2022-12-22 MED ORDER — BUPIVACAINE-EPINEPHRINE (PF) 0.25% -1:200000 IJ SOLN
INTRAMUSCULAR | Status: DC | PRN
  Administered 2022-12-22: 30 mL

## 2022-12-22 MED ORDER — VERAPAMIL HCL ER 240 MG PO TBCR
240.0000 mg | EXTENDED_RELEASE_TABLET | Freq: Every day | ORAL | Status: DC
Start: 2022-12-23 — End: 2022-12-23
  Administered 2022-12-23: 240 mg via ORAL
  Filled 2022-12-22: qty 1

## 2022-12-22 MED ORDER — HYDROCODONE-ACETAMINOPHEN 5-325 MG PO TABS
1.0000 | ORAL_TABLET | ORAL | Status: DC | PRN
  Administered 2022-12-22: 1 via ORAL
  Administered 2022-12-23 (×3): 2 via ORAL
  Filled 2022-12-22: qty 2
  Filled 2022-12-22: qty 1
  Filled 2022-12-22 (×2): qty 2

## 2022-12-22 MED ORDER — RIVAROXABAN 10 MG PO TABS
10.0000 mg | ORAL_TABLET | Freq: Every day | ORAL | Status: DC
  Administered 2022-12-23: 10 mg via ORAL
  Filled 2022-12-22: qty 1

## 2022-12-22 MED ORDER — PROPOFOL 10 MG/ML IV BOLUS
INTRAVENOUS | Status: DC | PRN
  Administered 2022-12-22: 10 mg via INTRAVENOUS
  Administered 2022-12-22: 30 mg via INTRAVENOUS
  Administered 2022-12-22: 20 mg via INTRAVENOUS
  Administered 2022-12-22 (×2): 10 mg via INTRAVENOUS
  Administered 2022-12-22: 30 mg via INTRAVENOUS

## 2022-12-22 MED ORDER — CHLORHEXIDINE GLUCONATE 0.12 % MT SOLN
15.0000 mL | Freq: Once | OROMUCOSAL | Status: AC
  Administered 2022-12-22: 15 mL via OROMUCOSAL

## 2022-12-22 MED ORDER — DEXAMETHASONE SODIUM PHOSPHATE 10 MG/ML IJ SOLN
10.0000 mg | Freq: Once | INTRAMUSCULAR | Status: AC
  Administered 2022-12-23: 10 mg via INTRAVENOUS
  Filled 2022-12-22: qty 1

## 2022-12-22 MED ORDER — CHLORHEXIDINE GLUCONATE CLOTH 2 % EX PADS
6.0000 | MEDICATED_PAD | Freq: Every day | CUTANEOUS | Status: DC
  Administered 2022-12-23: 6 via TOPICAL

## 2022-12-22 MED ORDER — CHLORTHALIDONE 25 MG PO TABS
25.0000 mg | ORAL_TABLET | Freq: Every day | ORAL | Status: DC
  Administered 2022-12-23: 25 mg via ORAL
  Filled 2022-12-22: qty 1

## 2022-12-22 MED ORDER — WATER FOR IRRIGATION, STERILE IR SOLN
Status: DC | PRN
  Administered 2022-12-22: 1000 mL

## 2022-12-22 MED ORDER — DEXAMETHASONE SODIUM PHOSPHATE 4 MG/ML IJ SOLN
INTRAMUSCULAR | Status: DC | PRN
  Administered 2022-12-22: 8 mg via INTRAVENOUS

## 2022-12-22 MED ORDER — TRANEXAMIC ACID-NACL 1000-0.7 MG/100ML-% IV SOLN
1000.0000 mg | INTRAVENOUS | Status: AC
  Administered 2022-12-22: 1000 mg via INTRAVENOUS
  Filled 2022-12-22: qty 100

## 2022-12-22 MED ORDER — METHOCARBAMOL 1000 MG/10ML IJ SOLN
500.0000 mg | Freq: Four times a day (QID) | INTRAMUSCULAR | Status: DC | PRN
  Administered 2022-12-22: 500 mg via INTRAVENOUS
  Filled 2022-12-22: qty 10

## 2022-12-22 MED ORDER — LIDOCAINE HCL (PF) 2 % IJ SOLN
INTRAMUSCULAR | Status: AC
  Filled 2022-12-22: qty 5

## 2022-12-22 MED ORDER — FENTANYL CITRATE PF 50 MCG/ML IJ SOSY: 25.0000 ug | PREFILLED_SYRINGE | INTRAMUSCULAR | Status: DC | PRN

## 2022-12-22 MED ORDER — ONDANSETRON HCL 4 MG PO TABS: 4.0000 mg | ORAL_TABLET | Freq: Four times a day (QID) | ORAL | Status: DC | PRN

## 2022-12-22 MED ORDER — DEXAMETHASONE SODIUM PHOSPHATE 10 MG/ML IJ SOLN
INTRAMUSCULAR | Status: AC
  Filled 2022-12-22: qty 1

## 2022-12-22 MED ORDER — PHENYLEPHRINE 80 MCG/ML (10ML) SYRINGE FOR IV PUSH (FOR BLOOD PRESSURE SUPPORT)
PREFILLED_SYRINGE | INTRAVENOUS | Status: AC
  Filled 2022-12-22: qty 10

## 2022-12-22 MED ORDER — POLYETHYLENE GLYCOL 3350 17 G PO PACK
17.0000 g | PACK | Freq: Every day | ORAL | Status: DC | PRN
Start: 2022-12-22 — End: 2022-12-23

## 2022-12-22 MED ORDER — ORAL CARE MOUTH RINSE: 15.0000 mL | Freq: Once | OROMUCOSAL | Status: AC

## 2022-12-22 MED ORDER — ACETAMINOPHEN 325 MG PO TABS: 325.0000 mg | ORAL_TABLET | Freq: Four times a day (QID) | ORAL | Status: DC | PRN

## 2022-12-22 MED ORDER — PROPOFOL 1000 MG/100ML IV EMUL
INTRAVENOUS | Status: AC
  Filled 2022-12-22: qty 100

## 2022-12-22 MED ORDER — METOCLOPRAMIDE HCL 5 MG/ML IJ SOLN: 5.0000 mg | Freq: Three times a day (TID) | INTRAMUSCULAR | Status: DC | PRN

## 2022-12-22 MED ORDER — DOCUSATE SODIUM 100 MG PO CAPS
100.0000 mg | ORAL_CAPSULE | Freq: Two times a day (BID) | ORAL | Status: DC
  Administered 2022-12-22 – 2022-12-23 (×2): 100 mg via ORAL
  Filled 2022-12-22 (×2): qty 1

## 2022-12-22 MED ORDER — ATORVASTATIN CALCIUM 20 MG PO TABS
20.0000 mg | ORAL_TABLET | Freq: Every day | ORAL | Status: DC
Start: 2022-12-23 — End: 2022-12-23
  Administered 2022-12-23: 20 mg via ORAL
  Filled 2022-12-22: qty 1

## 2022-12-22 MED ORDER — ONDANSETRON HCL 4 MG/2ML IJ SOLN: 4.0000 mg | Freq: Four times a day (QID) | INTRAMUSCULAR | Status: DC | PRN

## 2022-12-22 SURGICAL SUPPLY — 35 items
BAG COUNTER SPONGE SURGICOUNT (BAG) IMPLANT
BAG ZIPLOCK 12X15 (MISCELLANEOUS) IMPLANT
BLADE SAG 18X100X1.27 (BLADE) ×1 IMPLANT
COVER PERINEAL POST (MISCELLANEOUS) ×1 IMPLANT
COVER SURGICAL LIGHT HANDLE (MISCELLANEOUS) ×1 IMPLANT
CUP ACETBLR 54 OD PINNACLE (Hips) IMPLANT
DERMABOND ADVANCED .7 DNX12 (GAUZE/BANDAGES/DRESSINGS) ×1 IMPLANT
DRAPE FOOT SWITCH (DRAPES) ×1 IMPLANT
DRAPE STERI IOBAN 125X83 (DRAPES) ×1 IMPLANT
DRAPE U-SHAPE 47X51 STRL (DRAPES) ×2 IMPLANT
DRSG AQUACEL AG ADV 3.5X10 (GAUZE/BANDAGES/DRESSINGS) ×1 IMPLANT
DURAPREP 26ML APPLICATOR (WOUND CARE) ×1 IMPLANT
ELECT REM PT RETURN 15FT ADLT (MISCELLANEOUS) ×1 IMPLANT
GLOVE BIO SURGEON STRL SZ 6.5 (GLOVE) IMPLANT
GLOVE BIO SURGEON STRL SZ8 (GLOVE) ×1 IMPLANT
GLOVE BIOGEL PI IND STRL 6.5 (GLOVE) IMPLANT
GLOVE BIOGEL PI IND STRL 7.0 (GLOVE) IMPLANT
GLOVE BIOGEL PI IND STRL 8 (GLOVE) ×1 IMPLANT
GOWN STRL REUS W/ TWL LRG LVL3 (GOWN DISPOSABLE) ×1 IMPLANT
HEAD CERAMIC 36 PLUS5 (Hips) IMPLANT
HOLDER FOLEY CATH W/STRAP (MISCELLANEOUS) ×1 IMPLANT
KIT TURNOVER KIT A (KITS) IMPLANT
LINER MARATHON NEUT +4X54X36 (Hips) IMPLANT
MANIFOLD NEPTUNE II (INSTRUMENTS) ×1 IMPLANT
PACK ANTERIOR HIP CUSTOM (KITS) ×1 IMPLANT
PENCIL SMOKE EVACUATOR COATED (MISCELLANEOUS) ×1 IMPLANT
SPIKE FLUID TRANSFER (MISCELLANEOUS) ×1 IMPLANT
STEM FEMORAL SZ 5MM STD ACTIS (Stem) IMPLANT
SUT ETHIBOND NAB CT1 #1 30IN (SUTURE) ×1 IMPLANT
SUT MNCRL AB 4-0 PS2 18 (SUTURE) ×1 IMPLANT
SUT STRATAFIX 0 PDS 27 VIOLET (SUTURE) ×1
SUT VIC AB 2-0 CT1 TAPERPNT 27 (SUTURE) ×2 IMPLANT
SUTURE STRATFX 0 PDS 27 VIOLET (SUTURE) ×1 IMPLANT
TRAY FOLEY MTR SLVR 16FR STAT (SET/KITS/TRAYS/PACK) ×1 IMPLANT
TUBE SUCTION HIGH CAP CLEAR NV (SUCTIONS) ×1 IMPLANT

## 2022-12-22 NOTE — Interval H&P Note (Signed)
History and Physical Interval Note:  12/22/2022 11:00 AM  [name]  has presented today for surgery, with the diagnosis of left hip osteoarthritis.  The various methods of treatment have been discussed with the patient and family. After consideration of risks, benefits and other options for treatment, the patient has consented to  Procedure(s): TOTAL HIP ARTHROPLASTY ANTERIOR APPROACH (Left) as a surgical intervention.  The patient's history has been reviewed, patient examined, no change in status, stable for surgery.  I have reviewed the patient's chart and labs.  Questions were answered to the patient's satisfaction.     Gerald Maxwell Jilleen Essner

## 2022-12-22 NOTE — Plan of Care (Signed)
  Problem: Education: Goal: Knowledge of the procedure and recovery process will improve Outcome: Progressing   Problem: Bowel/Gastric: Goal: Gastrointestinal status for postoperative course will improve Outcome: Progressing   Problem: Pain Management: Goal: General experience of comfort will improve Outcome: Progressing   Problem: Skin Integrity: Goal: Demonstration of wound healing without infection will improve Outcome: Progressing   Problem: Urinary Elimination: Goal: Ability to avoid or minimize complications of infection will improve Outcome: Progressing

## 2022-12-22 NOTE — Anesthesia Procedure Notes (Addendum)
Procedure Name: MAC Date/Time: 12/22/2022 12:18 PM  Performed by: Dennison Nancy, CRNAPre-anesthesia Checklist: Patient identified, Emergency Drugs available, Suction available, Patient being monitored and Timeout performed Oxygen Delivery Method: Simple face mask Dental Injury: Teeth and Oropharynx as per pre-operative assessment

## 2022-12-22 NOTE — Anesthesia Preprocedure Evaluation (Signed)
Anesthesia Evaluation  Patient identified by MRN, date of birth, ID band Patient awake    Reviewed: Allergy & Precautions, NPO status , Patient's Chart, lab work & pertinent test results  Airway Mallampati: II  TM Distance: >3 FB Neck ROM: Full    Dental   Pulmonary Current Smoker and Patient abstained from smoking.   breath sounds clear to auscultation       Cardiovascular hypertension, Pt. on medications  Rhythm:Regular Rate:Normal     Neuro/Psych  Neuromuscular disease    GI/Hepatic Neg liver ROS, PUD,,,  Endo/Other  negative endocrine ROS    Renal/GU negative Renal ROS     Musculoskeletal  (+) Arthritis ,    Abdominal   Peds  Hematology negative hematology ROS (+)   Anesthesia Other Findings   Reproductive/Obstetrics                             Anesthesia Physical Anesthesia Plan  ASA: 2  Anesthesia Plan: Spinal and MAC   Post-op Pain Management: Ofirmev IV (intra-op)*   Induction:   PONV Risk Score and Plan: 0 and Dexamethasone, Ondansetron and Propofol infusion  Airway Management Planned: Natural Airway  Additional Equipment:   Intra-op Plan:   Post-operative Plan:   Informed Consent: I have reviewed the patients History and Physical, chart, labs and discussed the procedure including the risks, benefits and alternatives for the proposed anesthesia with the patient or authorized representative who has indicated his/her understanding and acceptance.       Plan Discussed with: CRNA  Anesthesia Plan Comments:        Anesthesia Quick Evaluation

## 2022-12-22 NOTE — Evaluation (Signed)
Physical Therapy Evaluation Patient Details Name: KERI HOELZLE MRN: [id number] DOB: February 14, 1965 Today's Date: 12/22/2022  History of Present Illness  57 yo male presents to therapy s/p L THA, anterior approach secondary to failure of conservative measures. Pt PMH includes but is not limited to: prostate ca and recent surgery (10/2022), carotid artery stenosis, HTN, lumbar pain s/p lumbar laminectomy, spinal stenosis, ulcer, macrocytosis, arthritis, tobacco abuse and gout.  Clinical Impression      Mohanad E Cillo is a 57 y.o. male POD 0 s/p L THA. Patient reports IND with mobility at baseline. Patient is now limited by functional impairments (see PT problem list below) and requires CGA for bed mobility and CGA and cues for transfers. Patient was able to ambulate 30 feet with RW and min A level of assist. Patient will benefit from continued skilled PT interventions to address impairments and progress towards PLOF. Acute PT will follow to progress mobility and stair training in preparation for safe discharge home with family support and HEP.     If plan is discharge home, recommend the following: A little help with walking and/or transfers;A little help with bathing/dressing/bathroom;Assistance with cooking/housework;Assist for transportation;Help with stairs or ramp for entrance   Can travel by private vehicle        Equipment Recommendations Rolling walker (2 wheels)  Recommendations for Other Services       Functional Status Assessment Patient has had a recent decline in their functional status and demonstrates the ability to make significant improvements in function in a reasonable and predictable amount of time.     Precautions / Restrictions Precautions Precautions: Fall Restrictions Weight Bearing Restrictions Per Provider Order: No      Mobility  Bed Mobility Overal bed mobility: Needs Assistance Bed Mobility: Supine to Sit     Supine to sit: Contact guard, HOB  elevated, Used rails     General bed mobility comments: min cues    Transfers Overall transfer level: Needs assistance Equipment used: Rolling walker (2 wheels) Transfers: Sit to/from Stand Sit to Stand: Contact guard assist, From elevated surface           General transfer comment: min cues    Ambulation/Gait Ambulation/Gait assistance: Min assist Gait Distance (Feet): 30 Feet Assistive device: Rolling walker (2 wheels) Gait Pattern/deviations: Step-to pattern, Antalgic, Staggering left, Staggering right, Trunk flexed Gait velocity: decreased     General Gait Details: due to limited sensation and proprioception of B feet, pt requried min A for stabiltiy, cues for attention to foot placement especially with turns  Stairs            Wheelchair Mobility     Tilt Bed    Modified Rankin (Stroke Patients Only)       Balance Overall balance assessment: Needs assistance Sitting-balance support: Feet supported Sitting balance-Leahy Scale: Good     Standing balance support: Bilateral upper extremity supported, During functional activity, Reliant on assistive device for balance Standing balance-Leahy Scale: Poor                               Pertinent Vitals/Pain Pain Assessment Pain Assessment: 0-10 Pain Score: 0-No pain Pain Location: L hip and LE Pain Descriptors / Indicators:  (starting to feel it) Pain Intervention(s): Limited activity within patient's tolerance, Monitored during session, Premedicated before session, Repositioned, Ice applied    Home Living Family/patient expects to be discharged to:: Private residence Living Arrangements: Spouse/significant other Available  Help at Discharge: Family Type of Home: House Home Access: Stairs to enter Entrance Stairs-Rails: Left Entrance Stairs-Number of Steps: 4   Home Layout: One level Home Equipment: Agricultural consultant (2 wheels)      Prior Function Prior Level of Function :  Independent/Modified Independent;Driving;Working/employed             Mobility Comments: IND no AD for all ADLs, self care tasks, and IADLs       Extremity/Trunk Assessment        Lower Extremity Assessment Lower Extremity Assessment: LLE deficits/detail LLE Deficits / Details: ankle DF/PF 5/5 LLE Sensation: decreased light touch;decreased proprioception (B feet, groin and buttock region)    Cervical / Trunk Assessment Cervical / Trunk Assessment: Normal  Communication   Communication Communication: No apparent difficulties  Cognition Arousal: Alert Behavior During Therapy: WFL for tasks assessed/performed Overall Cognitive Status: Within Functional Limits for tasks assessed                                          General Comments      Exercises     Assessment/Plan    PT Assessment Patient needs continued PT services  PT Problem List Decreased strength;Decreased range of motion;Decreased activity tolerance;Decreased balance;Decreased mobility;Decreased coordination;Pain       PT Treatment Interventions DME instruction;Gait training;Stair training;Functional mobility training;Therapeutic activities;Therapeutic exercise;Balance training;Neuromuscular re-education;Patient/family education;Modalities    PT Goals (Current goals can be found in the Care Plan section)  Acute Rehab PT Goals Patient Stated Goal: to be able to get back on the golf course and return to work in spring PT Goal Formulation: With patient Time For Goal Achievement: 01/05/23 Potential to Achieve Goals: Good    Frequency 7X/week     Co-evaluation               AM-PAC PT "6 Clicks" Mobility  Outcome Measure Help needed turning from your back to your side while in a flat bed without using bedrails?: A Little Help needed moving from lying on your back to sitting on the side of a flat bed without using bedrails?: A Little Help needed moving to and from a bed to a  chair (including a wheelchair)?: A Little Help needed standing up from a chair using your arms (e.g., wheelchair or bedside chair)?: A Little Help needed to walk in hospital room?: A Little Help needed climbing 3-5 steps with a railing? : A Lot 6 Click Score: 17    End of Session Equipment Utilized During Treatment: Gait belt Activity Tolerance: Patient tolerated treatment well Patient left: in chair;with call bell/phone within reach;with family/visitor present;with nursing/sitter in room Nurse Communication: Mobility status PT Visit Diagnosis: Unsteadiness on feet (R26.81);Other abnormalities of gait and mobility (R26.89);Muscle weakness (generalized) (M62.81);Difficulty in walking, not elsewhere classified (R26.2);Pain Pain - Right/Left: Left Pain - part of body: Hip;Leg    Time: 1740-1805 PT Time Calculation (min) (ACUTE ONLY): 25 min   Charges:   PT Evaluation $PT Eval Low Complexity: 1 Low PT Treatments $Gait Training: 8-22 mins PT General Charges $$ ACUTE PT VISIT: 1 Visit         Johnny Bridge, PT Acute Rehab   Jacqualyn Posey 12/22/2022, 7:06 PM

## 2022-12-22 NOTE — Transfer of Care (Signed)
Immediate Anesthesia Transfer of Care Note  Patient: Gerald Maxwell  Procedure(s) Performed: TOTAL HIP ARTHROPLASTY ANTERIOR APPROACH (Left: Hip)  Patient Location: PACU  Anesthesia Type:MAC and Spinal  Level of Consciousness: awake, alert , oriented, and patient cooperative  Airway & Oxygen Therapy: Patient Spontanous Breathing and Patient connected to face mask oxygen  Post-op Assessment: Report given to RN and Post -op Vital signs reviewed and stable  Post vital signs: Reviewed and stable  Last Vitals:  Vitals Value Taken Time  BP 121/56 12/22/22 1400  Temp    Pulse 69 12/22/22 1402  Resp 26 12/22/22 1403  SpO2 100 % 12/22/22 1402  Vitals shown include unfiled device data.  Last Pain:  Vitals:   12/22/22 1036  TempSrc:   PainSc: 6          Complications: No notable events documented.

## 2022-12-22 NOTE — Anesthesia Postprocedure Evaluation (Signed)
Anesthesia Post Note  Patient: Gerald Maxwell  Procedure(s) Performed: TOTAL HIP ARTHROPLASTY ANTERIOR APPROACH (Left: Hip)     Patient location during evaluation: PACU Anesthesia Type: Spinal Level of consciousness: awake and alert Pain management: pain level controlled Vital Signs Assessment: post-procedure vital signs reviewed and stable Respiratory status: spontaneous breathing and respiratory function stable Cardiovascular status: blood pressure returned to baseline and stable Postop Assessment: spinal receding Anesthetic complications: no  No notable events documented.  Last Vitals:  Vitals:   12/22/22 1858 12/22/22 2012  BP: 111/68 (!) 102/90  Pulse: 84 77  Resp:  17  Temp: 37.1 C 37.2 C  SpO2: 96% 96%    Last Pain:  Vitals:   12/22/22 2012  TempSrc: Oral  PainSc:                  Kennieth Rad

## 2022-12-22 NOTE — Discharge Instructions (Addendum)
Frank Aluisio, MD Total Joint Specialist [facility] [address] [place], [place] [address] [phone]  ANTERIOR APPROACH TOTAL HIP REPLACEMENT POSTOPERATIVE DIRECTIONS     Hip Rehabilitation, Guidelines Following Surgery  The results of a hip operation are greatly improved after range of motion and muscle strengthening exercises. Follow all safety measures which are given to protect your hip. If any of these exercises cause increased pain or swelling in your joint, decrease the amount until you are comfortable again. Then slowly increase the exercises. Call your caregiver if you have problems or questions.   BLOOD CLOT PREVENTION Take a 10 mg Xarelto once a day for three weeks following surgery. Then take an 81 mg Aspirin once a day for three weeks. Then discontinue Aspirin. You may resume your vitamins/supplements once you have discontinued the Xarelto. Do not take any NSAIDs (Advil, Aleve, Ibuprofen, Meloxicam, etc.) until you have discontinued the Xarelto.   HOME CARE INSTRUCTIONS  Remove items at home which could result in a fall. This includes throw rugs or furniture in walking pathways.  ICE to the affected hip as frequently as 20-30 minutes an hour and then as needed for pain and swelling. Continue to use ice on the hip for pain and swelling from surgery. You may notice swelling that will progress down to the foot and ankle. This is normal after surgery. Elevate the leg when you are not up walking on it.   Continue to use the breathing machine which will help keep your temperature down.  It is common for your temperature to cycle up and down following surgery, especially at night when you are not up moving around and exerting yourself.  The breathing machine keeps your lungs expanded and your temperature down.  DIET You may resume your previous home diet once your are discharged from the hospital.  DRESSING / WOUND CARE / SHOWERING You have an  adhesive waterproof bandage over the incision. Leave this in place until your first follow-up appointment. Once you remove this you will not need to place another bandage.  You may begin showering 3 days following surgery, but do not submerge the incision under water.  ACTIVITY For the first 3-5 days, it is important to rest and keep the operative leg elevated. You should, as a general rule, rest for 50 minutes and walk/stretch for 10 minutes per hour. After 5 days, you may slowly increase activity as tolerated.  Perform the exercises you were provided twice a day for about 15-20 minutes each session. Begin these 2 days following surgery. Walk with your walker as instructed. Use the walker until you are comfortable transitioning to a cane. Walk with the cane in the opposite hand of the operative leg. You may discontinue the cane once you are comfortable and walking steadily. Avoid periods of inactivity such as sitting longer than an hour when not asleep. This helps prevent blood clots.  Do not drive a car for 6 weeks or until released by your surgeon.  Do not drive while taking narcotics.  TED HOSE STOCKINGS Wear the elastic stockings on both legs for three weeks following surgery during the day. You may remove them at night while sleeping.  WEIGHT BEARING Weight bearing as tolerated with assist device (walker, cane, etc) as directed, use it as long as suggested by your surgeon or therapist, typically at least 4-6 weeks.  POSTOPERATIVE CONSTIPATION PROTOCOL Constipation - defined medically as fewer than three stools per week and severe constipation as less   than one stool per week.  One of the most common issues patients have following surgery is constipation.  Even if you have a regular bowel pattern at home, your normal regimen is likely to be disrupted due to multiple reasons following surgery.  Combination of anesthesia, postoperative narcotics, change in appetite and fluid intake all can  affect your bowels.  In order to avoid complications following surgery, here are some recommendations in order to help you during your recovery period.  Colace (docusate) - Pick up an over-the-counter form of Colace or another stool softener and take twice a day as long as you are requiring postoperative pain medications.  Take with a full glass of water daily.  If you experience loose stools or diarrhea, hold the colace until you stool forms back up.  If your symptoms do not get better within 1 week or if they get worse, check with your doctor. Dulcolax (bisacodyl) - Pick up over-the-counter and take as directed by the product packaging as needed to assist with the movement of your bowels.  Take with a full glass of water.  Use this product as needed if not relieved by Colace only.  MiraLax (polyethylene glycol) - Pick up over-the-counter to have on hand.  MiraLax is a solution that will increase the amount of water in your bowels to assist with bowel movements.  Take as directed and can mix with a glass of water, juice, soda, Fatheree, or tea.  Take if you go more than two days without a movement.Do not use MiraLax more than once per day. Call your doctor if you are still constipated or irregular after using this medication for 7 days in a row.  If you continue to have problems with postoperative constipation, please contact the office for further assistance and recommendations.  If you experience "the worst abdominal pain ever" or develop nausea or vomiting, please contact the office immediatly for further recommendations for treatment.  ITCHING  If you experience itching with your medications, try taking only a single pain pill, or even half a pain pill at a time.  You can also use Benadryl over the counter for itching or also to help with sleep.   MEDICATIONS See your medication summary on the "After Visit Summary" that the nursing staff will review with you prior to discharge.  You may have some home  medications which will be placed on hold until you complete the course of blood thinner medication.  It is important for you to complete the blood thinner medication as prescribed by your surgeon.  Continue your approved medications as instructed at time of discharge.  PRECAUTIONS If you experience chest pain or shortness of breath - call 911 immediately for transfer to the hospital emergency department.  If you develop a fever greater that 101 F, purulent drainage from wound, increased redness or drainage from wound, foul odor from the wound/dressing, or calf pain - CONTACT YOUR SURGEON.                                                   FOLLOW-UP APPOINTMENTS Make sure you keep all of your appointments after your operation with your surgeon and caregivers. You should call the office at the above phone number and make an appointment for approximately two weeks after the date of your surgery or on the date   instructed by your surgeon outlined in the "After Visit Summary".  RANGE OF MOTION AND STRENGTHENING EXERCISES  These exercises are designed to help you keep full movement of your hip joint. Follow your caregiver's or physical therapist's instructions. Perform all exercises about fifteen times, three times per day or as directed. Exercise both hips, even if you have had only one joint replacement. These exercises can be done on a training (exercise) mat, on the floor, on a table or on a bed. Use whatever works the best and is most comfortable for you. Use music or television while you are exercising so that the exercises are a pleasant break in your day. This will make your life better with the exercises acting as a break in routine you can look forward to.  Lying on your back, slowly slide your foot toward your buttocks, raising your knee up off the floor. Then slowly slide your foot back down until your leg is straight again.  Lying on your back spread your legs as far apart as you can without causing  discomfort.  Lying on your side, raise your upper leg and foot straight up from the floor as far as is comfortable. Slowly lower the leg and repeat.  Lying on your back, tighten up the muscle in the front of your thigh (quadriceps muscles). You can do this by keeping your leg straight and trying to raise your heel off the floor. This helps strengthen the largest muscle supporting your knee.  Lying on your back, tighten up the muscles of your buttocks both with the legs straight and with the knee bent at a comfortable angle while keeping your heel on the floor.   POST-OPERATIVE OPIOID TAPER INSTRUCTIONS: It is important to wean off of your opioid medication as soon as possible. If you do not need pain medication after your surgery it is ok to stop day one. Opioids include: Codeine, Hydrocodone(Norco, Vicodin), Oxycodone(Percocet, oxycontin) and hydromorphone amongst others.  Long term and even short term use of opiods can cause: Increased pain response Dependence Constipation Depression Respiratory depression And more.  Withdrawal symptoms can include Flu like symptoms Nausea, vomiting And more Techniques to manage these symptoms Hydrate well Eat regular healthy meals Stay active Use relaxation techniques(deep breathing, meditating, yoga) Do Not substitute Alcohol to help with tapering If you have been on opioids for less than two weeks and do not have pain than it is ok to stop all together.  Plan to wean off of opioids This plan should start within one week post op of your joint replacement. Maintain the same interval or time between taking each dose and first decrease the dose.  Cut the total daily intake of opioids by one tablet each day Next start to increase the time between doses. The last dose that should be eliminated is the evening dose.   IF YOU ARE TRANSFERRED TO A SKILLED REHAB FACILITY If the patient is transferred to a skilled rehab facility following release from the  hospital, a list of the current medications will be sent to the facility for the patient to continue.  When discharged from the skilled rehab facility, please have the facility set up the patient's Home Health Physical Therapy prior to being released. Also, the skilled facility will be responsible for providing the patient with their medications at time of release from the facility to include their pain medication, the muscle relaxants, and their blood thinner medication. If the patient is still at the rehab facility at   time of the two week follow up appointment, the skilled rehab facility will also need to assist the patient in arranging follow up appointment in our office and any transportation needs.  MAKE SURE YOU:  Understand these instructions.  Get help right away if you are not doing well or get worse.    DENTAL ANTIBIOTICS:  In most cases prophylactic antibiotics for Dental procdeures after total joint surgery are not necessary.  Exceptions are as follows:  1. History of prior total joint infection  2. Severely immunocompromised (Organ Transplant, cancer chemotherapy, Rheumatoid biologic meds such as Humera)  3. Poorly controlled diabetes (A1C &gt; 8.0, blood glucose over 200)  If you have one of these conditions, contact your surgeon for an antibiotic prescription, prior to your dental procedure.    Pick up stool softner and laxative for home use following surgery while on pain medications. Do not submerge incision under water. Please use good hand washing techniques while changing dressing each day. May shower starting three days after surgery. Please use a clean towel to pat the incision dry following showers. Continue to use ice for pain and swelling after surgery. Do not use any lotions or creams on the incision until instructed by your surgeon.    Information on my medicine - XARELTO (Rivaroxaban)    Why was Xarelto prescribed for you? Xarelto was prescribed  for you to reduce the risk of blood clots forming after orthopedic surgery. The medical term for these abnormal blood clots is venous thromboembolism (VTE).  What do you need to know about xarelto ? Take your Xarelto ONCE DAILY at the same time every day. You may take it either with or without food.  If you have difficulty swallowing the tablet whole, you may crush it and mix in applesauce just prior to taking your dose.  Take Xarelto exactly as prescribed by your doctor and DO NOT stop taking Xarelto without talking to the doctor who prescribed the medication.  Stopping without other VTE prevention medication to take the place of Xarelto may increase your risk of developing a clot.  After discharge, you should have regular check-up appointments with your healthcare provider that is prescribing your Xarelto.    What do you do if you miss a dose? If you miss a dose, take it as soon as you remember on the same day then continue your regularly scheduled once daily regimen the next day. Do not take two doses of Xarelto on the same day.   Important Safety Information A possible side effect of Xarelto is bleeding. You should call your healthcare provider right away if you experience any of the following: Bleeding from an injury or your nose that does not stop. Unusual colored urine (red or dark brown) or unusual colored stools (red or black). Unusual bruising for unknown reasons. A serious fall or if you hit your head (even if there is no bleeding).  Some medicines may interact with Xarelto and might increase your risk of bleeding while on Xarelto. To help avoid this, consult your healthcare provider or pharmacist prior to using any new prescription or non-prescription medications, including herbals, vitamins, non-steroidal anti-inflammatory drugs (NSAIDs) and supplements.  This website has more information on Xarelto: www.xarelto.com.    

## 2022-12-22 NOTE — Anesthesia Procedure Notes (Addendum)
Spinal  Patient location during procedure: OR Start time: 12/22/2022 12:16 PM End time: 12/22/2022 12:11 PM Reason for block: surgical anesthesia Staffing Performed: anesthesiologist  Anesthesiologist: Marcene Duos, MD Performed by: Marcene Duos, MD Authorized by: Marcene Duos, MD   Preanesthetic Checklist Completed: patient identified, IV checked, site marked, risks and benefits discussed, surgical consent, monitors and equipment checked, pre-op evaluation and timeout performed Spinal Block Patient position: sitting Prep: DuraPrep Patient monitoring: heart rate, cardiac monitor, continuous pulse ox and blood pressure Approach: midline Location: L3-4 Injection technique: single-shot Needle Needle type: Pencan  Needle gauge: 24 G Needle length: 9 cm Assessment Sensory level: T4 Events: CSF return

## 2022-12-22 NOTE — Op Note (Signed)
OPERATIVE REPORT- TOTAL HIP ARTHROPLASTY   PREOPERATIVE DIAGNOSIS: Osteoarthritis of the Left hip.   POSTOPERATIVE DIAGNOSIS: Osteoarthritis of the Left  hip.   PROCEDURE: Left total hip arthroplasty, anterior approach.   SURGEON: Ollen Gross, MD   ASSISTANT: Arcola Jansky, PA-C  ANESTHESIA:  Spinal  ESTIMATED BLOOD LOSS:-300 mL    DRAINS: None  COMPLICATIONS: None   CONDITION: PACU - hemodynamically stable.   BRIEF CLINICAL NOTE: Gerald Maxwell is a 56 y.o. male who has advanced end-  stage arthritis of their Left  hip with progressively worsening pain and  dysfunction.The patient has failed nonoperative management and presents for  total hip arthroplasty.   PROCEDURE IN DETAIL: After successful administration of spinal  anesthetic, the traction boots for the [facility] bed were placed on both  feet and the patient was placed onto the [facility] bed, boots placed into the leg  holders. The Left hip was then isolated from the perineum with plastic  drapes and prepped and draped in the usual sterile fashion. ASIS and  greater trochanter were marked and a oblique incision was made, starting  at about 1 cm lateral and 2 cm distal to the ASIS and coursing towards  the anterior cortex of the femur. The skin was cut with a 10 blade  through subcutaneous tissue to the level of the fascia overlying the  tensor fascia lata muscle. The fascia was then incised in line with the  incision at the junction of the anterior third and posterior 2/3rd. The  muscle was teased off the fascia and then the interval between the TFL  and the rectus was developed. The Hohmann retractor was then placed at  the top of the femoral neck over the capsule. The vessels overlying the  capsule were cauterized and the fat on top of the capsule was removed.  A Hohmann retractor was then placed anterior underneath the rectus  femoris to give exposure to the entire anterior capsule. A T-shaped  capsulotomy  was performed. The edges were tagged and the femoral head  was identified.       Osteophytes are removed off the superior acetabulum.  The femoral neck was then cut in situ with an oscillating saw. Traction  was then applied to the left lower extremity utilizing the [facility]  traction. The femoral head was then removed. Retractors were placed  around the acetabulum and then circumferential removal of the labrum was  performed. Osteophytes were also removed. Reaming starts at 49 mm to  medialize and  Increased in 2 mm increments to 53 mm. We reamed in  approximately 40 degrees of abduction, 20 degrees anteversion. A 54 mm  pinnacle acetabular shell was then impacted in anatomic position under  fluoroscopic guidance with excellent purchase. We did not need to place  any additional dome screws. A 36 mm neutral + 4 marathon liner was then  placed into the acetabular shell.       The femoral lift was then placed along the lateral aspect of the femur  just distal to the vastus ridge. The leg was  externally rotated and capsule  was stripped off the inferior aspect of the femoral neck down to the  level of the lesser trochanter, this was done with electrocautery. The femur was lifted after this was performed. The  leg was then placed in an extended and adducted position essentially delivering the femur. We also removed the capsule superiorly and the piriformis from the piriformis fossa to  gain excellent exposure of the  proximal femur. Rongeur was used to remove some cancellous bone to get  into the lateral portion of the proximal femur for placement of the  initial starter reamer. The starter broaches was placed  the starter broach  and was shown to go down the center of the canal. Broaching  with the Actis system was then performed starting at size 0  coursing  Up to size 5. A size 5 had excellent torsional and rotational  and axial stability. The trial standard offset neck was then placed  with a 36  + 5 trial head. The hip was then reduced. We confirmed that  the stem was in the canal both on AP and lateral x-rays. It also has excellent sizing. The hip was reduced with outstanding stability through full extension and full external rotation.. AP pelvis was taken and the leg lengths were measured and found to be equal. Hip was then dislocated again and the femoral head and neck removed. The  femoral broach was removed. Size 5 Actis stem with a standard offset  neck was then impacted into the femur following native anteversion. Has  excellent purchase in the canal. Excellent torsional and rotational and  axial stability. It is confirmed to be in the canal on AP and lateral  fluoroscopic views. The 36 + 5 ceramic head was placed and the hip  reduced with outstanding stability. Again AP pelvis was taken and it  confirmed that the leg lengths were equal. The wound was then copiously  irrigated with saline solution and the capsule reattached and repaired  with Ethibond suture. 30 ml of .25% Bupivicaine was  injected into the capsule and into the edge of the tensor fascia lata as well as subcutaneous tissue. The fascia overlying the tensor fascia lata was then closed with a running #1 V-Loc. Subcu was closed with interrupted 2-0 Vicryl and subcuticular running 4-0 Monocryl. Incision was cleaned  and dried. Steri-Strips and a bulky sterile dressing applied. The patient was awakened and transported to  recovery in stable condition.        Please note that a surgical assistant was a medical necessity for this procedure to perform it in a safe and expeditious manner. Assistant was necessary to provide appropriate retraction of vital neurovascular structures and to prevent femoral fracture and allow for anatomic placement of the prosthesis.  Ollen Gross, M.D.

## 2022-12-23 ENCOUNTER — Encounter (HOSPITAL_COMMUNITY): Payer: Self-pay | Admitting: Orthopedic Surgery

## 2022-12-23 DIAGNOSIS — M1612 Unilateral primary osteoarthritis, left hip: Secondary | ICD-10-CM | POA: Diagnosis not present

## 2022-12-23 LAB — CBC
HCT: 34.1 % — ABNORMAL LOW (ref 39.0–52.0)
Hemoglobin: 12.3 g/dL — ABNORMAL LOW (ref 13.0–17.0)
MCH: 37.8 pg — ABNORMAL HIGH (ref 26.0–34.0)
MCHC: 36.1 g/dL — ABNORMAL HIGH (ref 30.0–36.0)
MCV: 104.9 fL — ABNORMAL HIGH (ref 80.0–100.0)
Platelets: 138 10*3/uL — ABNORMAL LOW (ref 150–400)
RBC: 3.25 MIL/uL — ABNORMAL LOW (ref 4.22–5.81)
RDW: 14.6 % (ref 11.5–15.5)
WBC: 10.4 10*3/uL (ref 4.0–10.5)
nRBC: 0 % (ref 0.0–0.2)

## 2022-12-23 LAB — BASIC METABOLIC PANEL
Anion gap: 11 (ref 5–15)
BUN: 24 mg/dL — ABNORMAL HIGH (ref 6–20)
CO2: 22 mmol/L (ref 22–32)
Calcium: 8.9 mg/dL (ref 8.9–10.3)
Chloride: 94 mmol/L — ABNORMAL LOW (ref 98–111)
Creatinine, Ser: 1.19 mg/dL (ref 0.61–1.24)
GFR, Estimated: >60 mL/min (ref >60)
Glucose, Bld: 120 mg/dL — ABNORMAL HIGH (ref 70–99)
Potassium: 3.9 mmol/L (ref 3.5–5.1)
Sodium: 127 mmol/L — ABNORMAL LOW (ref 135–145)

## 2022-12-23 MED ORDER — HYDROCODONE-ACETAMINOPHEN 5-325 MG PO TABS: 1.0000 | ORAL_TABLET | Freq: Four times a day (QID) | ORAL | 0 refills | Status: AC | PRN

## 2022-12-23 MED ORDER — TRAMADOL HCL 50 MG PO TABS: 50.0000 mg | ORAL_TABLET | Freq: Four times a day (QID) | ORAL | 0 refills | Status: AC | PRN

## 2022-12-23 MED ORDER — RIVAROXABAN 10 MG PO TABS: 10.0000 mg | ORAL_TABLET | Freq: Every day | ORAL | 0 refills | Status: AC

## 2022-12-23 MED ORDER — ONDANSETRON HCL 4 MG PO TABS: 4.0000 mg | ORAL_TABLET | Freq: Four times a day (QID) | ORAL | 0 refills | Status: AC | PRN

## 2022-12-23 MED ORDER — METHOCARBAMOL 500 MG PO TABS: 500.0000 mg | ORAL_TABLET | Freq: Four times a day (QID) | ORAL | 0 refills | Status: AC | PRN

## 2022-12-23 NOTE — TOC Transition Note (Signed)
Transition of Care [facility]) - Discharge Note   Patient Details  Name: Gerald Maxwell MRN: [id number] Date of Birth: 1965-08-04  Transition of [facility]) CM/SW Contact:  Amada Jupiter, LCSW Phone Number: 12/23/2022, 2:51 PM   Clinical Narrative:    Pt medically cleared for dc and has needed DME in the home.  Plan for HEP.  No TOC needs.   Final next level of care: Home/Self Care Barriers to Discharge: No Barriers Identified   Patient Goals and CMS Choice            Discharge Placement                       Discharge Plan and Services Additional resources added to the After Visit Summary for                  DME Arranged: N/A DME Agency: NA                  Social Drivers of Health (SDOH) Interventions SDOH Screenings   Food Insecurity: Patient Declined (12/22/2022)  Housing: Patient Declined (12/22/2022)  Transportation Needs: Patient Declined (12/22/2022)  Utilities: Patient Declined (12/22/2022)  Financial Resource Strain: Low Risk  (02/16/2022)   Received from [facility], [facility]  Physical Activity: Insufficiently Active (11/10/2020)   Received from [facility], [facility]  Social Connections: Unknown (05/18/2021)   Received from [facility], [facility]  Stress: Stress Concern Present (11/10/2020)   Received from [facility], [facility]  Tobacco Use: High Risk (12/22/2022)     Readmission Risk Interventions     No data to display

## 2022-12-23 NOTE — Progress Notes (Signed)
Physical Therapy Treatment Patient Details Name: Gerald Maxwell MRN: [id number] DOB: Oct 09, 1965 Today's Date: 12/23/2022   History of Present Illness 57 yo male presents to therapy s/p L THA, anterior approach secondary to failure of conservative measures. Pt PMH includes but is not limited to: prostate ca and recent surgery (10/2022), carotid artery stenosis, HTN, lumbar pain s/p lumbar laminectomy, spinal stenosis, ulcer, macrocytosis, arthritis, tobacco abuse and gout.    PT Comments  Pt ambulated in hallway, practiced safe stair technique, and performed LE exercises.  Pt provided with HEP handout.  Pt had no further questions and feels ready for d/c home today.     If plan is discharge home, recommend the following: A little help with walking and/or transfers;A little help with bathing/dressing/bathroom;Assistance with cooking/housework;Assist for transportation;Help with stairs or ramp for entrance   Can travel by private vehicle        Equipment Recommendations  None recommended by PT    Recommendations for Other Services       Precautions / Restrictions Precautions Precautions: Fall Restrictions Weight Bearing Restrictions Per Provider Order: No     Mobility  Bed Mobility Overal bed mobility: Needs Assistance Bed Mobility: Supine to Sit     Supine to sit: Supervision, HOB elevated          Transfers Overall transfer level: Needs assistance Equipment used: Rolling walker (2 wheels) Transfers: Sit to/from Stand Sit to Stand: Contact guard assist           General transfer comment: verbal cues for UE and LE positioning for pain control    Ambulation/Gait Ambulation/Gait assistance: Contact guard assist Gait Distance (Feet): 160 Feet Assistive device: Rolling walker (2 wheels) Gait Pattern/deviations: Step-to pattern, Antalgic, Step-through pattern, Decreased stance time - left       General Gait Details: verbal cues for sequence, RW positioning,  step length; pt with some UE shakiness but no overt LOB observed   Stairs Stairs: Yes Stairs assistance: Contact guard assist Stair Management: Step to pattern, Forwards, One rail Left Number of Stairs: 3 General stair comments: verbal cues for sequence and safety; pt reports understanding   Wheelchair Mobility     Tilt Bed    Modified Rankin (Stroke Patients Only)       Balance                                            Cognition Arousal: Alert Behavior During Therapy: WFL for tasks assessed/performed Overall Cognitive Status: Within Functional Limits for tasks assessed                                          Exercises Total Joint Exercises Ankle Circles/Pumps: AROM, Both, 10 reps Quad Sets: AROM, Both, 10 reps Heel Slides: AAROM, Left, 10 reps Hip ABduction/ADduction: AAROM, Left, 10 reps, AROM, Supine, Standing Long Arc Quad: AROM, Left, Seated, 10 reps Knee Flexion: AROM, Standing, Left, 10 reps Marching in Standing: AROM, Left, 10 reps, Standing Standing Hip Extension: AROM, Left, 10 reps, Standing    General Comments        Pertinent Vitals/Pain Pain Assessment Pain Assessment: 0-10 Pain Score: 8  Pain Location: left hip Pain Descriptors / Indicators: Aching, Sore Pain Intervention(s): Repositioned, Monitored during session, Patient requesting pain meds-RN notified  Home Living                          Prior Function            PT Goals (current goals can now be found in the care plan section) Progress towards PT goals: Progressing toward goals    Frequency    7X/week      PT Plan      Co-evaluation              AM-PAC PT "6 Clicks" Mobility   Outcome Measure  Help needed turning from your back to your side while in a flat bed without using bedrails?: A Little Help needed moving from lying on your back to sitting on the side of a flat bed without using bedrails?: A Little Help  needed moving to and from a bed to a chair (including a wheelchair)?: A Little Help needed standing up from a chair using your arms (e.g., wheelchair or bedside chair)?: A Little Help needed to walk in hospital room?: A Little Help needed climbing 3-5 steps with a railing? : A Little 6 Click Score: 18    End of Session Equipment Utilized During Treatment: Gait belt Activity Tolerance: Patient tolerated treatment well Patient left: in chair;with call bell/phone within reach Nurse Communication: Mobility status PT Visit Diagnosis: Difficulty in walking, not elsewhere classified (R26.2)     Time: 4540-9811 PT Time Calculation (min) (ACUTE ONLY): 23 min  Charges:    $Gait Training: 8-22 mins $Therapeutic Exercise: 8-22 mins PT General Charges $$ ACUTE PT VISIT: 1 Visit                     Gerald Maxwell, DPT Physical Therapist Acute Rehabilitation Services Office: [phone]    Gerald Maxwell Gerald Maxwell 12/23/2022, 3:53 PM

## 2022-12-23 NOTE — Progress Notes (Signed)
   Subjective: 1 Day Post-Op Procedure(s) (LRB): TOTAL HIP ARTHROPLASTY ANTERIOR APPROACH (Left) Patient seen in rounds by Dr. Lequita Halt. Patient is well, and has had no acute complaints or problems. Denies SOB or chest pain. Denies calf pain. Foley cath removed this AM. Patient reports pain as moderate. Worked with physical therapy yesterday and ambulated 30'. We will continue physical therapy today.   Objective: Vital signs in last 24 hours: Temp:  [98.1 F (36.7 C)-99.6 F (37.6 C)] 98.1 F (36.7 C) (12/19 0636) Pulse Rate:  [56-101] 69 (12/19 0636) Resp:  [12-18] 16 (12/19 0636) BP: (71-154)/(48-95) 154/82 (12/19 0636) SpO2:  [94 %-100 %] 98 % (12/19 0636) Weight:  [68.8 kg-68.9 kg] 68.8 kg (12/18 1656)  Intake/Output from previous day:  Intake/Output Summary (Last 24 hours) at 12/23/2022 0745 Last data filed at 12/23/2022 0630 Gross per 24 hour  Intake 2771.28 ml  Output 2575 ml  Net 196.28 ml     Intake/Output this shift: No intake/output data recorded.  Labs: Recent Labs    12/23/22 0506  HGB 12.3*   Recent Labs    12/23/22 0506  WBC 10.4  RBC 3.25*  HCT 34.1*  PLT 138*   Recent Labs    12/23/22 0506  NA 127*  K 3.9  CL 94*  CO2 22  BUN 24*  CREATININE 1.19  GLUCOSE 120*  CALCIUM 8.9   No results for input(s): "LABPT", "INR" in the last 72 hours.  Exam: General - Patient is Alert and Oriented Extremity - Neurologically intact Neurovascular intact Sensation intact distally Dorsiflexion/Plantar flexion intact Dressing - dressing C/D/I Motor Function - intact, moving foot and toes well on exam.  Past Medical History:  Diagnosis Date   Arthritis    Gout    High cholesterol    Hypertension    Prostate cancer (HCC)    Stomach ulcer     Assessment/Plan: 1 Day Post-Op Procedure(s) (LRB): TOTAL HIP ARTHROPLASTY ANTERIOR APPROACH (Left) Principal Problem:   OA (osteoarthritis) of hip Active Problems:   Primary osteoarthritis of left  hip  Estimated body mass index is 23.06 kg/m as calculated from the following:   Height as of this encounter: 5\' 8"  (1.727 m).   Weight as of this encounter: 68.8 kg. Advance diet Up with therapy D/C IV fluids  DVT Prophylaxis - Xarelto Weight bearing as tolerated.  Continue physical therapy. Expected discharge home today pending progress with physical therapy and if meeting patient goals. Will do HEP once discharged. Follow-up in clinic in 2 weeks.  The PDMP database was reviewed today prior to any opioid medications being prescribed to this patient.  R. Arcola Jansky, PA-C Orthopedic Surgery 12/23/2022, 7:45 AM

## 2022-12-23 NOTE — Progress Notes (Signed)
Reviewed d/c instructions with pt. All questions answered. Pt awaiting transport.

## 2022-12-26 NOTE — Discharge Summary (Signed)
Physician Discharge Summary   Patient ID: Gerald Maxwell MRN: [id number] DOB/AGE: 03-15-1965 57 y.o.  Admit date: 12/22/2022 Discharge date: 12/23/2022  Primary Diagnosis: Osteoarthritis, left hip   Admission Diagnoses:  Past Medical History:  Diagnosis Date   Arthritis    Gout    High cholesterol    Hypertension    Prostate cancer (HCC)    Stomach ulcer    Discharge Diagnoses:   Principal Problem:   OA (osteoarthritis) of hip Active Problems:   Primary osteoarthritis of left hip  Estimated body mass index is 23.06 kg/m as calculated from the following:   Height as of this encounter: 5\' 8"  (1.727 m).   Weight as of this encounter: 68.8 kg.  Procedure:  Procedure(s) (LRB): TOTAL HIP ARTHROPLASTY ANTERIOR APPROACH (Left)   Consults: None  HPI: Gerald Maxwell is a 57 y.o. male who has advanced end-stage arthritis of their Left  hip with progressively worsening pain and dysfunction.The patient has failed nonoperative management and presents for  total hip arthroplasty.   Laboratory Data: Admission on 12/22/2022, Discharged on 12/23/2022  Component Date Value Ref Range Status   WBC 12/23/2022 10.4  4.0 - 10.5 K/uL Final   RBC 12/23/2022 3.25 (L)  4.22 - 5.81 MIL/uL Final   Hemoglobin 12/23/2022 12.3 (L)  13.0 - 17.0 g/dL Final   HCT 40/98/1191 34.1 (L)  39.0 - 52.0 % Final   MCV 12/23/2022 104.9 (H)  80.0 - 100.0 fL Final   MCH 12/23/2022 37.8 (H)  26.0 - 34.0 pg Final   MCHC 12/23/2022 36.1 (H)  30.0 - 36.0 g/dL Final   RDW 47/82/9562 14.6  11.5 - 15.5 % Final   Platelets 12/23/2022 138 (L)  150 - 400 K/uL Final   nRBC 12/23/2022 0.0  0.0 - 0.2 % Final   Performed at [facility], [address], [place], [place] [address]   Sodium 12/23/2022 127 (L)  135 - 145 mmol/L Final   Potassium 12/23/2022 3.9  3.5 - 5.1 mmol/L Final   Chloride 12/23/2022 94 (L)  98 - 111 mmol/L Final   CO2 12/23/2022 22  22 - 32 mmol/L Final   Glucose, Bld 12/23/2022  120 (H)  70 - 99 mg/dL Final   Glucose reference range applies only to samples taken after fasting for at least 8 hours.   BUN 12/23/2022 24 (H)  6 - 20 mg/dL Final   Creatinine, Ser 12/23/2022 1.19  0.61 - 1.24 mg/dL Final   Calcium 57/84/6962 8.9  8.9 - 10.3 mg/dL Final   GFR, Estimated 12/23/2022 >60  >60 mL/min Final   Comment: (NOTE) Calculated using the CKD-EPI Creatinine Equation (2021)    Anion gap 12/23/2022 11  5 - 15 Final   Performed at [facility], [address] [place], [place] [address]  Hospital Outpatient Visit on 12/14/2022  Component Date Value Ref Range Status   Sodium 12/14/2022 132 (L)  135 - 145 mmol/L Final   Potassium 12/14/2022 3.8  3.5 - 5.1 mmol/L Final   Chloride 12/14/2022 94 (L)  98 - 111 mmol/L Final   CO2 12/14/2022 23  22 - 32 mmol/L Final   Glucose, Bld 12/14/2022 99  70 - 99 mg/dL Final   Glucose reference range applies only to samples taken after fasting for at least 8 hours.   BUN 12/14/2022 15  6 - 20 mg/dL Final   Creatinine, Ser 12/14/2022 0.98  0.61 - 1.24 mg/dL Final   Calcium  12/14/2022 9.9  8.9 - 10.3 mg/dL Final   GFR, Estimated 12/14/2022 >60  >60 mL/min Final   Comment: (NOTE) Calculated using the CKD-EPI Creatinine Equation (2021)    Anion gap 12/14/2022 15  5 - 15 Final   Performed at [facility], [address], [place], [place] [address]   ABO/RH(D) 12/14/2022 O POS   Final   Antibody Screen 12/14/2022 NEG   Final   Sample Expiration 12/14/2022 12/25/2022,2359   Final   Extend sample reason 12/14/2022    Final                   Value:NO TRANSFUSIONS OR PREGNANCY IN THE PAST 3 MONTHS Performed at [facility], [address], [place], [place] [address]    WBC 12/14/2022 9.2  4.0 - 10.5 K/uL Final   RBC 12/14/2022 3.67 (L)  4.22 - 5.81 MIL/uL Final   Hemoglobin 12/14/2022 13.9  13.0 - 17.0 g/dL Final   HCT 74/25/9563 38.9 (L)  39.0 - 52.0 % Final   MCV 12/14/2022 106.0  (H)  80.0 - 100.0 fL Final   MCH 12/14/2022 37.9 (H)  26.0 - 34.0 pg Final   MCHC 12/14/2022 35.7  30.0 - 36.0 g/dL Final   RDW 87/56/4332 15.8 (H)  11.5 - 15.5 % Final   Platelets 12/14/2022 164  150 - 400 K/uL Final   nRBC 12/14/2022 0.0  0.0 - 0.2 % Final   Performed at [facility], [address] [place], [place] [address]   MRSA, PCR 12/14/2022 NEGATIVE  NEGATIVE Final   Staphylococcus aureus 12/14/2022 NEGATIVE  NEGATIVE Final   Comment: (NOTE) The Xpert SA Assay (FDA approved for NASAL specimens in patients 38 years of age and older), is one component of a comprehensive surveillance program. It is not intended to diagnose infection nor to guide or monitor treatment. Performed at [facility], [address], [place], [place] [address]      X-Rays:DG Pelvis Portable Result Date: 12/22/2022 CLINICAL DATA:  Status post total left hip arthroplasty. EXAM: PORTABLE PELVIS 1-2 VIEWS COMPARISON:  None Available. FINDINGS: Status post recent total left hip arthroplasty. No perihardware lucency is seen to indicate hardware failure or loosening. Moderate lateral left hip postoperative subcutaneous air. Mild right femoroacetabular joint space narrowing. Mild bilateral sacroiliac subchondral sclerosis and joint space narrowing. Mild superior pubic symphysis joint space narrowing. Hernia mesh coils overlie the lower abdomen. Surgical clips overlie the right iliac crest. Mild atherosclerotic calcifications. No acute fracture or dislocation. IMPRESSION: Status post recent total left hip arthroplasty without evidence of hardware failure. Electronically Signed   By: Neita Garnet M.D.   On: 12/22/2022 17:16   DG HIP UNILAT WITH PELVIS 1V LEFT Result Date: 12/22/2022 CLINICAL DATA:  Status post total left hip arthroplasty. Intraoperative fluoroscopy. EXAM: DG HIP (WITH OR WITHOUT PELVIS) 1V*L* COMPARISON:  None Available. FINDINGS: Images were performed  intraoperatively without the presence of a radiologist. The patient is undergoing total left hip arthroplasty. No hardware complication is seen. Total fluoroscopy images: 6 Total fluoroscopy time: 9 seconds Total dose: Radiation Exposure Index (as provided by the fluoroscopic device): 0.91 mGy air Kerma Please see intraoperative findings for further detail. IMPRESSION: Intraoperative fluoroscopy for total left hip arthroplasty. Electronically Signed   By: Neita Garnet M.D.   On: 12/22/2022 16:50   DG C-Arm 1-60 Min-No Report Result Date: 12/22/2022 Fluoroscopy was utilized by the requesting physician.  No radiographic interpretation.  EKG: Orders placed or performed during the hospital encounter of 10/13/22   EKG 12 lead per protocol   EKG 12 lead per protocol     Hospital Course: Gerald Maxwell is a 57 y.o. who was admitted to [facility]. They were brought to the operating room on 12/22/2022 and underwent Procedure(s): TOTAL HIP ARTHROPLASTY ANTERIOR APPROACH.  Patient tolerated the procedure well and was later transferred to the recovery room and then to the orthopaedic floor for postoperative care. They were given PO and IV analgesics for pain control following their surgery. They were given 24 hours of postoperative antibiotics of  Anti-infectives (From admission, onward)    Start     Dose/Rate Route Frequency Ordered Stop   12/22/22 1830  ceFAZolin (ANCEF) IVPB 2g/100 mL premix        2 g 200 mL/hr over 30 Minutes Intravenous Every 6 hours 12/22/22 1646 12/23/22 1001   12/22/22 1045  ceFAZolin (ANCEF) IVPB 2g/100 mL premix        2 g 200 mL/hr over 30 Minutes Intravenous On call to O.R. 12/22/22 1031 12/22/22 1246      and started on DVT prophylaxis in the form of Xarelto.   PT and OT were ordered for total joint protocol. Discharge planning consulted to help with postop disposition and equipment needs.  Patient had a fair night on the evening of surgery. They started to  get up OOB with therapy on POD #0. Pt was seen during rounds and was ready to go home pending progress with therapy. He worked with therapy on POD #1 and was meeting his goals. Pt was discharged to home later that day in stable condition.  Diet: Regular diet Activity: WBAT Follow-up: in 2 weeks Disposition: Home Discharged Condition: stable   Discharge Instructions     Call MD / Call 911   Complete by: As directed    If you experience chest pain or shortness of breath, CALL 911 and be transported to the hospital emergency room.  If you develope a fever above 101 F, pus (white drainage) or increased drainage or redness at the wound, or calf pain, call your surgeon's office.   Change dressing   Complete by: As directed    You have an adhesive waterproof bandage over the incision. Leave this in place until your first follow-up appointment. Once you remove this you will not need to place another bandage.   Constipation Prevention   Complete by: As directed    Drink plenty of fluids.  Prune juice may be helpful.  You may use a stool softener, such as Colace (over the counter) 100 mg twice a day.  Use MiraLax (over the counter) for constipation as needed.   Diet - low sodium heart healthy   Complete by: As directed    Do not sit on low chairs, stoools or toilet seats, as it may be difficult to get up from low surfaces   Complete by: As directed    Driving restrictions   Complete by: As directed    No driving for two weeks   Post-operative opioid taper instructions:   Complete by: As directed    POST-OPERATIVE OPIOID TAPER INSTRUCTIONS: It is important to wean off of your opioid medication as soon as possible. If you do not need pain medication after your surgery it is ok to stop day one. Opioids include: Codeine, Hydrocodone(Norco, Vicodin), Oxycodone(Percocet, oxycontin) and hydromorphone amongst others.  Long term and even short term use of  opiods can cause: Increased pain  response Dependence Constipation Depression Respiratory depression And more.  Withdrawal symptoms can include Flu like symptoms Nausea, vomiting And more Techniques to manage these symptoms Hydrate well Eat regular healthy meals Stay active Use relaxation techniques(deep breathing, meditating, yoga) Do Not substitute Alcohol to help with tapering If you have been on opioids for less than two weeks and do not have pain than it is ok to stop all together.  Plan to wean off of opioids This plan should start within one week post op of your joint replacement. Maintain the same interval or time between taking each dose and first decrease the dose.  Cut the total daily intake of opioids by one tablet each day Next start to increase the time between doses. The last dose that should be eliminated is the evening dose.      TED hose   Complete by: As directed    Use stockings (TED hose) for three weeks on both leg(s).  You may remove them at night for sleeping.   Weight bearing as tolerated   Complete by: As directed       Allergies as of 12/23/2022   No Known Allergies      Medication List     TAKE these medications    acetaminophen 500 MG tablet Commonly known as: TYLENOL Take 1,000-1,500 mg by mouth every 8 (eight) hours as needed for moderate pain.   allopurinol 300 MG tablet Commonly known as: ZYLOPRIM Take 300 mg by mouth daily.   atorvastatin 20 MG tablet Commonly known as: LIPITOR Take 20 mg by mouth daily.   chlorthalidone 25 MG tablet Commonly known as: HYGROTON Take 25 mg by mouth daily.   cyanocobalamin 1000 MCG tablet Commonly known as: VITAMIN B12 Take 1,000 mcg by mouth daily.   HYDROcodone-acetaminophen 5-325 MG tablet Commonly known as: NORCO/VICODIN Take 1-2 tablets by mouth every 6 (six) hours as needed for severe pain (pain score 7-10).   lisinopril 40 MG tablet Commonly known as: ZESTRIL Take 40 mg by mouth daily.   MAGNESIUM GLYCINATE  PO Take 200 mg by mouth daily.   methocarbamol 500 MG tablet Commonly known as: ROBAXIN Take 1 tablet (500 mg total) by mouth every 6 (six) hours as needed for muscle spasms.   multivitamin with minerals Tabs tablet Take 1 tablet by mouth daily.   ondansetron 4 MG tablet Commonly known as: ZOFRAN Take 1 tablet (4 mg total) by mouth every 6 (six) hours as needed for nausea.   rivaroxaban 10 MG Tabs tablet Commonly known as: XARELTO Take 1 tablet (10 mg total) by mouth daily with breakfast for 20 days. Then take one 81 mg aspirin once a day for three weeks. Then discontinue aspirin.   tadalafil 5 MG tablet Commonly known as: CIALIS Take 5 mg by mouth daily.   tadalafil 20 MG tablet Commonly known as: CIALIS Take 20 mg by mouth daily as needed for erectile dysfunction.   traMADol 50 MG tablet Commonly known as: ULTRAM Take 1-2 tablets (50-100 mg total) by mouth every 6 (six) hours as needed for moderate pain (pain score 4-6). What changed: reasons to take this   verapamil 240 MG CR tablet Commonly known as: CALAN-SR Take 240 mg by mouth daily.               Discharge Care Instructions  (From admission, onward)           Start     Ordered  12/23/22 0000  Weight bearing as tolerated        12/23/22 0747   12/23/22 0000  Change dressing       Comments: You have an adhesive waterproof bandage over the incision. Leave this in place until your first follow-up appointment. Once you remove this you will not need to place another bandage.   12/23/22 0747            Follow-up Information     Ollen Gross, MD. Schedule an appointment as soon as possible for a visit in 2 week(s).   Specialty: Orthopedic Surgery Contact information: [address] [place] [address] [phone]                 Signed: R. Arcola Jansky, PA-C Orthopedic Surgery 12/26/2022, 8:29 AM

## 2023-11-07 ENCOUNTER — Encounter: Payer: Self-pay | Admitting: Radiology
